# Patient Record
Sex: Male | Born: 1960 | Race: White | Hispanic: No | Marital: Married | Smoking: Never smoker
Health system: Southern US, Community
[De-identification: ages and names within clinical notes are randomized; demographics above are authoritative.]

## PROBLEM LIST (undated history)

## (undated) DIAGNOSIS — J45909 Unspecified asthma, uncomplicated: Secondary | ICD-10-CM

## (undated) DIAGNOSIS — Z8489 Family history of other specified conditions: Secondary | ICD-10-CM

## (undated) DIAGNOSIS — I10 Essential (primary) hypertension: Secondary | ICD-10-CM

## (undated) DIAGNOSIS — N289 Disorder of kidney and ureter, unspecified: Secondary | ICD-10-CM

## (undated) HISTORY — PX: SHOULDER ARTHROSCOPY WITH ROTATOR CUFF REPAIR: SHX5685

## (undated) HISTORY — PX: URETERAL STENT PLACEMENT: SHX822

---

## 2013-12-12 ENCOUNTER — Encounter (INDEPENDENT_AMBULATORY_CARE_PROVIDER_SITE_OTHER): Payer: Self-pay | Admitting: *Deleted

## 2015-05-14 ENCOUNTER — Other Ambulatory Visit (HOSPITAL_COMMUNITY): Payer: Self-pay | Admitting: Registered Nurse

## 2015-05-14 ENCOUNTER — Other Ambulatory Visit (HOSPITAL_COMMUNITY): Payer: Self-pay | Admitting: Family Medicine

## 2015-05-14 ENCOUNTER — Ambulatory Visit (HOSPITAL_COMMUNITY)
Admission: RE | Admit: 2015-05-14 | Discharge: 2015-05-14 | Disposition: A | Discharge status: Home / Self Care | Payer: BLUE CROSS/BLUE SHIELD | Source: Ambulatory Visit | Attending: Registered Nurse | Admitting: Registered Nurse

## 2015-05-14 DIAGNOSIS — I10 Essential (primary) hypertension: Secondary | ICD-10-CM | POA: Diagnosis not present

## 2015-05-14 DIAGNOSIS — M25512 Pain in left shoulder: Secondary | ICD-10-CM

## 2015-05-14 DIAGNOSIS — Z1389 Encounter for screening for other disorder: Secondary | ICD-10-CM | POA: Diagnosis not present

## 2015-05-14 DIAGNOSIS — E782 Mixed hyperlipidemia: Secondary | ICD-10-CM | POA: Diagnosis not present

## 2015-05-14 DIAGNOSIS — E6609 Other obesity due to excess calories: Secondary | ICD-10-CM | POA: Diagnosis not present

## 2015-05-14 DIAGNOSIS — Z6832 Body mass index (BMI) 32.0-32.9, adult: Secondary | ICD-10-CM | POA: Diagnosis not present

## 2015-06-24 DIAGNOSIS — Z1389 Encounter for screening for other disorder: Secondary | ICD-10-CM | POA: Diagnosis not present

## 2015-06-24 DIAGNOSIS — Z6832 Body mass index (BMI) 32.0-32.9, adult: Secondary | ICD-10-CM | POA: Diagnosis not present

## 2015-06-24 DIAGNOSIS — M25512 Pain in left shoulder: Secondary | ICD-10-CM | POA: Diagnosis not present

## 2015-07-04 DIAGNOSIS — M7552 Bursitis of left shoulder: Secondary | ICD-10-CM | POA: Diagnosis not present

## 2015-07-31 DIAGNOSIS — M7502 Adhesive capsulitis of left shoulder: Secondary | ICD-10-CM | POA: Diagnosis not present

## 2015-08-19 ENCOUNTER — Ambulatory Visit: Payer: BLUE CROSS/BLUE SHIELD | Attending: Orthopedic Surgery | Admitting: Physical Therapy

## 2015-08-19 DIAGNOSIS — M25612 Stiffness of left shoulder, not elsewhere classified: Secondary | ICD-10-CM | POA: Diagnosis not present

## 2015-08-19 DIAGNOSIS — M25512 Pain in left shoulder: Secondary | ICD-10-CM | POA: Insufficient documentation

## 2015-08-19 NOTE — Therapy (Signed)
[facility] [address] [place], [place], [address] Phone: [phone]   Fax:  [phone]  Physical Therapy Evaluation  Patient Details  Name: Jamie Russell MRN: [id number] Date of Birth: [birth year] Referring Provider: Ralene Bathe PA-C.  Encounter Date: 08/19/2015      PT End of Session - 08/19/15 1016    Visit Number 1   Number of Visits 12   Date for PT Re-Evaluation 09/16/15   PT Start Time 0902   Activity Tolerance Patient tolerated treatment well   Behavior During Therapy [facility] for tasks assessed/performed      No past medical history on file.  No past surgical history on file.  There were no vitals filed for this visit.       Subjective Assessment - 08/19/15 0957    Subjective I got an injection that was helpful.   Limitations --  "Reaching."   Patient Stated Goals Use my left shoulder without pain.   Currently in Pain? Yes   Pain Score 7    Pain Location Shoulder   Pain Orientation Left   Pain Descriptors / Indicators Aching   Pain Onset More than a month ago   Pain Frequency Constant   Aggravating Factors  Movement.   Pain Relieving Factors Rest and ice.   Effect of Pain on Daily Activities Can't use left arm well.            [facility] PT Assessment - 08/19/15 0001      Assessment   Medical Diagnosis Left shoulder adhesive capsulitis.   Referring Provider Ralene Bathe PA-C.   Onset Date/Surgical Date --  March 2017.     Precautions   Precautions None     Restrictions   Weight Bearing Restrictions Yes     Balance Screen   Has the patient fallen in the past 6 months Yes   How many times? --  3   Has the patient had a decrease in activity level because of a fear of falling?  Yes     Home Environment   Living Environment Private residence     Prior Function   Level of Independence Independent     Cognition   Overall Cognitive Status Within Functional Limits for tasks assessed      Posture/Postural Control   Posture/Postural Control Postural limitations   Postural Limitations --  Left scapular protraction.     ROM / Strength   AROM / PROM / Strength AROM;Strength     AROM   Overall AROM Comments Left shoulder flexion limited to 88 degrees; ER= 10 degrees and behind back to left greater trochanter region.     Strength   Overall Strength Comments Abduction= 3-/5; ER= 3+/5 and IR= 4-/5.     Palpation   Palpation comment Tender to palpation over left acromial ridge with referred pain into left middle deltoid region.     Special Tests    Special Tests --  2+/4+ LT UE DTR's; (+) Impingement LT shoulder.     Ambulation/Gait   Gait Comments WNL.                   [facility] Adult PT Treatment/Exercise - 08/19/15 0001      Modalities   Modalities Electrical Stimulation     Electrical Stimulation   Electrical Stimulation Location --  Left shoulder.   Electrical Stimulation Action IFC   Electrical Stimulation Parameters 80-150 Hz xat 100% scan x 20 minutes.   Electrical Stimulation Goals Pain  PT Education - 08/19/15 1014    Education provided Yes   Person(s) Educated Patient   Methods Explanation;Demonstration;Tactile cues   Comprehension Verbalized understanding;Returned demonstration             PT Long Term Goals - 08/19/15 1053      PT LONG TERM GOAL #1   Title Independent with a HEP.   Time 4   Period Weeks   Status New     PT LONG TERM GOAL #2   Title Active left shoulder flexion to 150 degrees so the patient can easily reach overhead   Time 4   Period Weeks   Status New     PT LONG TERM GOAL #3   Title Active ER to 70 degrees+ to allow for easily donning/doffing of apparel   Time 4   Period Weeks   Status New     PT LONG TERM GOAL #4   Title Increase ROM so patient is able to reach behind back to L4 with left hand.   Time 4   Period Weeks   Status New     PT LONG TERM GOAL #5   Title  Increase left shoulder strength to a solid 4+/5 to increase stability for performance of functional activities   Time 4   Period Weeks   Status New     Additional Long Term Goals   Additional Long Term Goals Yes     PT LONG TERM GOAL #6   Title Perform ADL's with pain not > 3/10.   Time 4   Period Weeks   Status New               Plan - 08/19/15 1017    Clinical Impression Statement The patient states that in March of 2017 he began to experience left shoulder pain.  He recalls prior to this having a fall a work and falling backward on an extended left hand.  He experienced left wrist pain but does not recall left shoulder pain at that time.  His left shoulder beagn to hurt more and he also began to lose mobility.  He states it felt like a golf ball under his shoulder blade but he rolled on it with a roller type object and that went away.  His sleep is disturbed by pain.  He has significant lose of motion in all directions and referred pain into the left midle deltoid region.  Pain rise to 7+/10 with certain end range movements.  Ice decreases his pain.  X-ray and MRI's were negative.   Rehab Potential Good   PT Frequency 3x / week   PT Duration 4 weeks   PT Treatment/Interventions ADLs/Self Care Home Management;Electrical Stimulation;Ultrasound;Moist Heat;Cryotherapy;Patient/family education;Therapeutic exercise;Therapeutic activities;Manual techniques;Passive range of motion;Vasopneumatic Device;Dry needling   PT Next Visit Plan Left shoulder capsular stretches; PROM; U/S while performing a manual left shoulder ER stretch.  Please review HEP.   Consulted and Agree with Plan of Care Patient      Patient will benefit from skilled therapeutic intervention in order to improve the following deficits and impairments:  Decreased range of motion, Decreased activity tolerance, Pain, Decreased strength  Visit Diagnosis: Pain in left shoulder - Plan: PT plan of care  cert/re-cert  Stiffness of left shoulder, not elsewhere classified - Plan: PT plan of care cert/re-cert     Problem List There are no active problems to display for this patient.   [place], [place] MPT 08/19/2015, 10:57 AM  [facility] [address] [place], [place], [address] Phone: [phone]   Fax:  [phone]  Name: Jamie Russell MRN: [id number] Date of Birth: [birth year]

## 2015-08-19 NOTE — Patient Instructions (Signed)
Instruct patient in wall climbs; countertop stretch to increase flexion and supine cane exercise to increase ER.  Please review next visit.

## 2015-08-21 ENCOUNTER — Ambulatory Visit: Payer: BLUE CROSS/BLUE SHIELD | Attending: Orthopedic Surgery | Admitting: Physical Therapy

## 2015-08-21 DIAGNOSIS — M25512 Pain in left shoulder: Secondary | ICD-10-CM | POA: Insufficient documentation

## 2015-08-21 DIAGNOSIS — M25612 Stiffness of left shoulder, not elsewhere classified: Secondary | ICD-10-CM

## 2015-08-21 NOTE — Patient Instructions (Addendum)
Flexibility: Corner Stretch    Standing in corner with hands just above shoulder level and feet ____ inches from corner, lean forward until a comfortable stretch is felt across chest. Hold ____ seconds. Repeat ____ times per set. Do ____ sets per session. Do ____ sessions per day.  http://orth.exer.us/342   Copyright  VHI. All rights reserved.  ROM: External Rotation (Alternate)    Keep palm of left hand against door frame and elbow bent at 90. Turn body from fixed hand until stretch is felt. Hold __10__ seconds. Repeat _10___ times per set. Do ____ sets per session. Do _4-5___ sessions per day.  http://orth.exer.us/764   Copyright  VHI. All rights reserved.   TOWEL STRETCH:  Place a large towel over your right shoulder and reach behind with your left hand. Pull on the towel with your right hand so that left hand goes up your back. Hold 10 seconds, repeat 10 times. Do 4-5x a day.

## 2015-08-21 NOTE — Therapy (Signed)
[facility] [address] [place], [place], [address] Phone: [phone]   Fax:  [phone]  Physical Therapy Treatment  Patient Details  Name: Jamie Russell MRN: [id number] Date of Birth: 1960/09/02 Referring Provider: Ralene Bathe PA-C.  Encounter Date: 08/21/2015      PT End of Session - 08/21/15 1039    Visit Number 2   Number of Visits 12   Date for PT Re-Evaluation 09/16/15   PT Start Time 1034   PT Stop Time 1123   PT Time Calculation (min) 49 min   Activity Tolerance Patient tolerated treatment well   Behavior During Therapy [facility] for tasks assessed/performed      No past medical history on file.  No past surgical history on file.  There were no vitals filed for this visit.      Subjective Assessment - 08/21/15 1038    Subjective Reports that he is not sleeping well at night but as he gets moving after waking the soreness diminishes. Reports that he has a pulley set in his garage and uses it every time goes into his garage.   Patient Stated Goals Use my left shoulder without pain.   Currently in Pain? Yes   Pain Score 2    Pain Location Shoulder   Pain Orientation Left   Pain Descriptors / Indicators Sore   Pain Onset More than a month ago            [facility] PT Assessment - 08/21/15 0001      Assessment   Medical Diagnosis Left shoulder adhesive capsulitis.   Next MD Visit 08/28/2015     Precautions   Precautions None                     [facility] Adult PT Treatment/Exercise - 08/21/15 0001      Exercises   Exercises Shoulder     Shoulder Exercises: Pulleys   Flexion 3 minutes     Shoulder Exercises: ROM/Strengthening   UBE (Upper Arm Bike) 120 RPM x5 min     Shoulder Exercises: Stretch   Internal Rotation Stretch Other (comment)  10 reps x10 sec hold   External Rotation Stretch Other (comment)  10 reps x10 sec hold     Modalities   Modalities Electrical  Stimulation;Cryotherapy;Ultrasound     Cryotherapy   Number Minutes Cryotherapy 15 Minutes   Cryotherapy Location Shoulder   Type of Cryotherapy Ice pack     Electrical Stimulation   Electrical Stimulation Location L shoulder    Electrical Stimulation Action IFC   Electrical Stimulation Parameters 80-150 hz x15 min   Electrical Stimulation Goals Pain     Ultrasound   Ultrasound Location L anterior shoulder with LLDS into ER   Ultrasound Parameters 1.5 w/cm2, 100%, x10 min   Ultrasound Goals Pain     Manual Therapy   Manual Therapy Joint mobilization;Soft tissue mobilization;Passive ROM   Joint Mobilization G I-II L glenohumeral joint mobilizations in A/P/I to reduce pain and promote mobility   Soft tissue mobilization STW of L Pectoralis in supine to decrease tightness present   Passive ROM PROM of L shoulder into flexion to increase ROM                PT Education - 08/21/15 1115    Education provided Yes   Education Details HEP- ER and towel stretch   Person(s) Educated Patient   Methods Explanation;Demonstration;Verbal cues;Handout   Comprehension Verbalized understanding;Returned demonstration;Verbal cues  required             PT Long Term Goals - 08/19/15 1053      PT LONG TERM GOAL #1   Title Independent with a HEP.   Time 4   Period Weeks   Status New     PT LONG TERM GOAL #2   Title Active left shoulder flexion to 150 degrees so the patient can easily reach overhead   Time 4   Period Weeks   Status New     PT LONG TERM GOAL #3   Title Active ER to 70 degrees+ to allow for easily donning/doffing of apparel   Time 4   Period Weeks   Status New     PT LONG TERM GOAL #4   Title Increase ROM so patient is able to reach behind back to L4 with left hand.   Time 4   Period Weeks   Status New     PT LONG TERM GOAL #5   Title Increase left shoulder strength to a solid 4+/5 to increase stability for performance of functional activities    Time 4   Period Weeks   Status New     Additional Long Term Goals   Additional Long Term Goals Yes     PT LONG TERM GOAL #6   Title Perform ADL's with pain not > 3/10.   Time 4   Period Weeks   Status New               Plan - 08/21/15 1154    Clinical Impression Statement Patient arrived to treatment with low level L shoulder soreness but still limited with ROM and sleeping  due to pain. Patient able to tolerate exercises fairly well and required assistance with pulleys and stretches due to ROM limitations. Patient accepted new capsular stretching HEP with education regarding parameters and technique. Patient also required education to utilize heat/ ice to his preference for 10-20 minutes at a time and to continue pulleys in his garage. L Pectoralis muscle tight upon palpation with moderate release with manual therapy. Normal modalities response noted following removal of the modalities. L shoulder ER very limited during LLDS with [place]. Patient reported L shoulder feeling "good" following today's treatment.   Rehab Potential Good   PT Frequency 3x / week   PT Duration 4 weeks   PT Treatment/Interventions ADLs/Self Care Home Management;Electrical Stimulation;Ultrasound;Moist Heat;Cryotherapy;Patient/family education;Therapeutic exercise;Therapeutic activities;Manual techniques;Passive range of motion;Vasopneumatic Device;Dry needling   PT Next Visit Plan Continue L capsular stretching, exercises with modalities as needed per MPT POC.   PT Home Exercise Plan HEP- ER , towel stretch   Consulted and Agree with Plan of Care Patient      Patient will benefit from skilled therapeutic intervention in order to improve the following deficits and impairments:  Decreased range of motion, Decreased activity tolerance, Pain, Decreased strength  Visit Diagnosis: Pain in left shoulder  Stiffness of left shoulder, not elsewhere classified     Problem List There are no active problems to  display for this patient.   Evelene Croon, PTA 08/21/2015, 12:00 PM  [facility] [address] [place], [place], [address] Phone: [phone]   Fax:  [phone]  Name: Jamie Russell MRN: [id number] Date of Birth: 1960/09/27

## 2015-08-26 ENCOUNTER — Ambulatory Visit: Payer: BLUE CROSS/BLUE SHIELD | Admitting: Physical Therapy

## 2015-08-26 ENCOUNTER — Encounter: Payer: Self-pay | Admitting: Physical Therapy

## 2015-08-26 DIAGNOSIS — M25512 Pain in left shoulder: Secondary | ICD-10-CM | POA: Diagnosis not present

## 2015-08-26 DIAGNOSIS — M25612 Stiffness of left shoulder, not elsewhere classified: Secondary | ICD-10-CM

## 2015-08-26 NOTE — Therapy (Signed)
[facility] [address] [place], [place], [address] Phone: [phone]   Fax:  [phone]  Physical Therapy Treatment  Patient Details  Name: Jamie Russell MRN: [id number] Date of Birth: 06/27/1960 Referring Provider: Ralene Bathe PA-C.  Encounter Date: 08/26/2015      PT End of Session - 08/26/15 1039    Visit Number 3   Number of Visits 12   Date for PT Re-Evaluation 09/16/15   PT Start Time 0944   PT Stop Time 1044   PT Time Calculation (min) 60 min   Activity Tolerance Patient tolerated treatment well   Behavior During Therapy [facility] for tasks assessed/performed      History reviewed. No pertinent past medical history.  History reviewed. No pertinent surgical history.  There were no vitals filed for this visit.      Subjective Assessment - 08/26/15 1009    Subjective Patient reported feeling better after last treatment   Patient Stated Goals Use my left shoulder without pain.   Currently in Pain? Yes   Pain Score 2    Pain Location Shoulder   Pain Orientation Left   Pain Descriptors / Indicators Sore   Pain Onset More than a month ago   Pain Frequency Constant   Aggravating Factors  movement or night   Pain Relieving Factors rest            [facility] PT Assessment - 08/26/15 0001      ROM / Strength   AROM / PROM / Strength PROM     PROM   PROM Assessment Site Shoulder   Right/Left Shoulder Left   Left Shoulder Flexion 124 Degrees   Left Shoulder External Rotation 25 Degrees                     [facility] Adult PT Treatment/Exercise - 08/26/15 0001      Shoulder Exercises: Pulleys   Flexion --      Shoulder Exercises: ROM/Strengthening   UBE (Upper Arm Bike) 120 RPM x8 min     Electrical Stimulation   Electrical Stimulation Location L shoulder    Electrical Stimulation Action IFC   Electrical Stimulation Parameters 80-150hz    Electrical Stimulation Goals Pain     Ultrasound   Ultrasound Location left anterior shoulder with LLLD stretch into ER   Ultrasound Parameters 1.5 w/cm2/100%/45mhz x48min   Ultrasound Goals Pain     Manual Therapy   Manual Therapy Joint mobilization;Soft tissue mobilization;Passive ROM   Joint Mobilization G I-II L glenohumeral joint mobilizations in A/P/I to reduce pain and promote mobility   Soft tissue mobilization STW of L Pectoralis in supine to decrease tightness present   Passive ROM PROM of L shoulder into ER / flexion to increase ROM, ant cap stretch                     PT Long Term Goals - 08/26/15 1041      PT LONG TERM GOAL #1   Title Independent with a HEP.   Time 4   Period Weeks   Status On-going     PT LONG TERM GOAL #2   Title Active left shoulder flexion to 150 degrees so the patient can easily reach overhead   Time 4   Period Weeks   Status On-going     PT LONG TERM GOAL #3   Title Active ER to 70 degrees+ to allow for easily donning/doffing of apparel   Time  4   Period Weeks   Status On-going     PT LONG TERM GOAL #4   Title Increase ROM so patient is able to reach behind back to L4 with left hand.   Time 4   Period Weeks   Status On-going     PT LONG TERM GOAL #5   Title Increase left shoulder strength to a solid 4+/5 to increase stability for performance of functional activities   Time 4   Period Weeks   Status On-going     PT LONG TERM GOAL #6   Title Perform ADL's with pain not > 3/10.   Time 4   Period Weeks   Status On-going               Plan - 08/26/15 1042    Clinical Impression Statement Patient continues to progress with ROM yet patients shoulder is very stiff and painful with ROM. Patient has more pain with movement or at night when laying on left side. Patient has less palpable tightness in left pectoralis muscle today yet has referred pain with capsular stretching. Patient goals ongoing today due to ROM and pain deficits.    Rehab Potential Good   PT Frequency  3x / week   PT Duration 4 weeks   PT Treatment/Interventions ADLs/Self Care Home Management;Electrical Stimulation;Ultrasound;Moist Heat;Cryotherapy;Patient/family education;Therapeutic exercise;Therapeutic activities;Manual techniques;Passive range of motion;Vasopneumatic Device;Dry needling   PT Next Visit Plan Continue L capsular stretching, exercises with modalities as needed per MPT POC.   Consulted and Agree with Plan of Care Patient      Patient will benefit from skilled therapeutic intervention in order to improve the following deficits and impairments:  Decreased range of motion, Decreased activity tolerance, Pain, Decreased strength  Visit Diagnosis: Pain in left shoulder  Stiffness of left shoulder, not elsewhere classified     Problem List There are no active problems to display for this patient.   Zannah Melucci P, PTA 08/26/2015, 10:56 AM   Cathie Hoopshristina Jakaya Jacobowitz, PTA 08/26/15 10:56 AM    Clarita CraneStephanie F Matthews, PT, DPT 08/26/15 12:47 PM   [facility] [address] [place], [place], [address] Phone: [phone]   Fax:  [phone]  Name: Jamie SnowballHoward Kloc MRN: [id number] Date of Birth: 07-27-1960

## 2015-08-28 DIAGNOSIS — M7502 Adhesive capsulitis of left shoulder: Secondary | ICD-10-CM | POA: Diagnosis not present

## 2015-08-29 ENCOUNTER — Encounter: Payer: Self-pay | Admitting: Physical Therapy

## 2015-08-29 ENCOUNTER — Ambulatory Visit: Payer: BLUE CROSS/BLUE SHIELD | Admitting: Physical Therapy

## 2015-08-29 DIAGNOSIS — M25512 Pain in left shoulder: Secondary | ICD-10-CM | POA: Diagnosis not present

## 2015-08-29 DIAGNOSIS — M25612 Stiffness of left shoulder, not elsewhere classified: Secondary | ICD-10-CM | POA: Diagnosis not present

## 2015-08-29 NOTE — Therapy (Signed)
[facility] [address] [place], [place], [address] Phone: [phone]   Fax:  [phone]  Physical Therapy Treatment  Patient Details  Name: Arvin Abello MRN: [id number] Date of Birth: 1960/09/30 Referring Provider: Ralene Bathe PA-C.  Encounter Date: 08/29/2015      PT End of Session - 08/29/15 1031    Visit Number 4   Number of Visits 12   Date for PT Re-Evaluation 09/16/15   PT Start Time 0947   PT Stop Time 1046   PT Time Calculation (min) 59 min   Activity Tolerance Patient tolerated treatment well   Behavior During Therapy [facility] for tasks assessed/performed      History reviewed. No pertinent past medical history.  History reviewed. No pertinent surgical history.  There were no vitals filed for this visit.      Subjective Assessment - 08/29/15 0953    Subjective Patient went to MD. Supple and had a shot in bursa and is to continue with aggressive ROM 2-3 x week for 4 weeks   Patient Stated Goals Use my left shoulder without pain.   Currently in Pain? Yes   Pain Score 6    Pain Location Shoulder   Pain Orientation Left   Pain Descriptors / Indicators Sore   Pain Onset More than a month ago   Pain Frequency Constant   Aggravating Factors  movement   Pain Relieving Factors rest            [facility] PT Assessment - 08/29/15 0001      PROM   PROM Assessment Site Shoulder   Right/Left Shoulder Left   Left Shoulder Flexion 130 Degrees   Left Shoulder External Rotation 40 Degrees                     [facility] Adult PT Treatment/Exercise - 08/29/15 0001      Modalities   Modalities Moist Heat;Electrical Stimulation     Moist Heat Therapy   Moist Heat Location Shoulder  ongoing with stretching today to help relax muscle     Electrical Stimulation   Electrical Stimulation Location L shoulder    Electrical Stimulation Action IFC   Electrical Stimulation Parameters 80-150hz    Electrical Stimulation  Goals Pain     Manual Therapy   Manual Therapy Joint mobilization;Soft tissue mobilization;Passive ROM   Joint Mobilization G I-II L glenohumeral joint mobilizations in A/P/I to reduce pain and promote mobility   Passive ROM PROM and aggressive stretching of L shoulder into ER / flexion to increase ROM, ant cap stretch                     PT Long Term Goals - 08/26/15 1041      PT LONG TERM GOAL #1   Title Independent with a HEP.   Time 4   Period Weeks   Status On-going     PT LONG TERM GOAL #2   Title Active left shoulder flexion to 150 degrees so the patient can easily reach overhead   Time 4   Period Weeks   Status On-going     PT LONG TERM GOAL #3   Title Active ER to 70 degrees+ to allow for easily donning/doffing of apparel   Time 4   Period Weeks   Status On-going     PT LONG TERM GOAL #4   Title Increase ROM so patient is able to reach behind back to L4 with left hand.  Time 4   Period Weeks   Status On-going     PT LONG TERM GOAL #5   Title Increase left shoulder strength to a solid 4+/5 to increase stability for performance of functional activities   Time 4   Period Weeks   Status On-going     PT LONG TERM GOAL #6   Title Perform ADL's with pain not > 3/10.   Time 4   Period Weeks   Status On-going               Plan - 08/29/15 1035    Clinical Impression Statement Patient progressing with ROM today. Patient is very guarded and has tightness noted in shoulder with all motions esp ER. Today focused o manual stretching per MD for aggressive PROM. MD. told patient he will have a manipulation if unable to improve ROM. Patient able to tolerate well with cues to relax. Performed only ROM today due to a shot yesterday. Patient goals ongoing due to ROM deficits.    Rehab Potential Good   PT Frequency 3x / week   PT Duration 4 weeks   PT Treatment/Interventions ADLs/Self Care Home Management;Electrical Stimulation;Ultrasound;Moist  Heat;Cryotherapy;Patient/family education;Therapeutic exercise;Therapeutic activities;Manual techniques;Passive range of motion;Vasopneumatic Device;Dry needling   PT Next Visit Plan cont with aggressive PROM per MD new order to avoid a manipulation.    Consulted and Agree with Plan of Care Patient      Patient will benefit from skilled therapeutic intervention in order to improve the following deficits and impairments:  Decreased range of motion, Decreased activity tolerance, Pain, Decreased strength  Visit Diagnosis: Pain in left shoulder  Stiffness of left shoulder, not elsewhere classified     Problem List There are no active problems to display for this patient.   Hermelinda DellenDUNFORD, Sakeena Teall P, PTA 08/29/2015, 10:46 AM  [facility] [address] [place], [place], [address] Phone: [phone]   Fax:  [phone]  Name: Lamont SnowballHoward Schult MRN: [id number] Date of Birth: [birth year]

## 2015-09-02 ENCOUNTER — Ambulatory Visit: Payer: BLUE CROSS/BLUE SHIELD | Admitting: Physical Therapy

## 2015-09-02 DIAGNOSIS — M25612 Stiffness of left shoulder, not elsewhere classified: Secondary | ICD-10-CM

## 2015-09-02 DIAGNOSIS — M25512 Pain in left shoulder: Secondary | ICD-10-CM | POA: Diagnosis not present

## 2015-09-02 NOTE — Therapy (Signed)
[facility] [address] [place], [place], [address] Phone: [phone]   Fax:  [phone]  Physical Therapy Treatment  Patient Details  Name: Jamie SnowballHoward Russell MRN: [id number] Date of Birth: [birth year] Referring Provider: Ralene Batheracy Shuford PA-C.  Encounter Date: 09/02/2015      PT End of Session - 09/02/15 1435    Visit Number 5   Number of Visits 12   Date for PT Re-Evaluation 09/16/15   PT Start Time 1432   PT Stop Time 1540   PT Time Calculation (min) 68 min   Activity Tolerance Patient tolerated treatment well   Behavior During Therapy [facility] for tasks assessed/performed      No past medical history on file.  No past surgical history on file.  There were no vitals filed for this visit.      Subjective Assessment - 09/02/15 1434    Subjective Reports only intermittant UE soreness today. Reports that he thinks the shot has improved his stiffness. Experiencing less severe soreness around L shoulder blade.   Patient Stated Goals Use my left shoulder without pain.   Currently in Pain? Yes   Pain Score 3    Pain Location Shoulder   Pain Orientation Left   Pain Descriptors / Indicators Sore   Pain Onset More than a month ago   Pain Frequency Intermittent            [facility] PT Assessment - 09/02/15 0001      Assessment   Medical Diagnosis Left shoulder adhesive capsulitis.   Next MD Visit 09/28/2015     Precautions   Precautions None                     [facility] Adult PT Treatment/Exercise - 09/02/15 0001      Shoulder Exercises: Pulleys   Flexion Other (comment)  x5 min   Other Pulley Exercises LUE ranger flex/circles x20 reps each     Shoulder Exercises: ROM/Strengthening   UBE (Upper Arm Bike) 120 RPM x5 min     Shoulder Exercises: Stretch   Internal Rotation Stretch 10 seconds  x5 reps   External Rotation Stretch Other (comment)  x5 min with UE range     Modalities   Modalities Moist Heat;Electrical  Stimulation     Moist Heat Therapy   Number Minutes Moist Heat 15 Minutes   Moist Heat Location Shoulder     Electrical Stimulation   Electrical Stimulation Location L shoulder    Electrical Stimulation Action IFC   Electrical Stimulation Parameters 80-150 hz x15 min   Electrical Stimulation Goals Pain     Ultrasound   Ultrasound Location L shoulder from anterior to posterior   Ultrasound Parameters 1.2 w/c,2 100%, 3.3 mhz x10 min   Ultrasound Goals Pain     Manual Therapy   Manual Therapy Soft tissue mobilization;Passive ROM   Soft tissue mobilization STW of L Pectoralis/ Deltoids in supine to decrease tightness present   Passive ROM PROM of L shouler into flexion and ER with holds at end range                     PT Long Term Goals - 08/26/15 1041      PT LONG TERM GOAL #1   Title Independent with a HEP.   Time 4   Period Weeks   Status On-going     PT LONG TERM GOAL #2   Title Active left shoulder flexion to 150 degrees  so the patient can easily reach overhead   Time 4   Period Weeks   Status On-going     PT LONG TERM GOAL #3   Title Active ER to 70 degrees+ to allow for easily donning/doffing of apparel   Time 4   Period Weeks   Status On-going     PT LONG TERM GOAL #4   Title Increase ROM so patient is able to reach behind back to L4 with left hand.   Time 4   Period Weeks   Status On-going     PT LONG TERM GOAL #5   Title Increase left shoulder strength to a solid 4+/5 to increase stability for performance of functional activities   Time 4   Period Weeks   Status On-going     PT LONG TERM GOAL #6   Title Perform ADL's with pain not > 3/10.   Time 4   Period Weeks   Status On-going               Plan - 09/02/15 1533    Clinical Impression Statement Patient continues to demonstrate improvements with L shoulder ROM with stretching using UE ranger and well as with PROM into flexion. Patient continues to have L anterior capsule  tightness as evidenced by difficulty with IR towel stretch. Patient experienced greater ease with UBE exercise today as he stated he felt as he was going faster today. Normal modalities response noted following removal of the modalities. FIrm end feel noted with PROM into L shoulder flexion and ER but ER continues to be very limited to just neutral position. Tightness noted with L Deltoids and minimal tightness noted into L Pectoralis upon palpation.   Rehab Potential Good   PT Frequency 3x / week   PT Duration 4 weeks   PT Treatment/Interventions ADLs/Self Care Home Management;Electrical Stimulation;Ultrasound;Moist Heat;Cryotherapy;Patient/family education;Therapeutic exercise;Therapeutic activities;Manual techniques;Passive range of motion;Vasopneumatic Device;Dry needling   PT Next Visit Plan cont with aggressive PROM per MD new order to avoid a manipulation.    PT Home Exercise Plan HEP- ER , towel stretch   Consulted and Agree with Plan of Care Patient      Patient will benefit from skilled therapeutic intervention in order to improve the following deficits and impairments:  Decreased range of motion, Decreased activity tolerance, Pain, Decreased strength  Visit Diagnosis: Pain in left shoulder  Stiffness of left shoulder, not elsewhere classified     Problem List There are no active problems to display for this patient.   Evelene CroonKelsey M Parsons, PTA 09/02/2015, 3:47 PM  [facility] [address] [place], [place], [address] Phone: [phone]   Fax:  [phone]  Name: Jamie SnowballHoward Russell MRN: [id number] Date of Birth: Jun 18, 1960

## 2015-09-03 ENCOUNTER — Ambulatory Visit: Payer: BLUE CROSS/BLUE SHIELD | Admitting: *Deleted

## 2015-09-03 DIAGNOSIS — M25612 Stiffness of left shoulder, not elsewhere classified: Secondary | ICD-10-CM

## 2015-09-03 DIAGNOSIS — M25512 Pain in left shoulder: Secondary | ICD-10-CM | POA: Diagnosis not present

## 2015-09-03 NOTE — Therapy (Signed)
[facility] [address] [place], [place], [address] Phone: [phone]   Fax:  [phone]  Physical Therapy Treatment  Patient Details  Name: Jamie SnowballHoward Russell MRN: [id number] Date of Birth: [birth year] Referring Provider: Ralene Batheracy Shuford PA-C.  Encounter Date: 09/03/2015      PT End of Session - 09/03/15 0824    Visit Number 6   Number of Visits 12   Date for PT Re-Evaluation 09/16/15   PT Start Time 0815   PT Stop Time 0915   PT Time Calculation (min) 60 min      No past medical history on file.  No past surgical history on file.  There were no vitals filed for this visit.      Subjective Assessment - 09/03/15 0817    Subjective Lt shldr ached all night. 8/10   Patient Stated Goals Use my left shoulder without pain.   Currently in Pain? Yes   Pain Score 8    Pain Location Shoulder   Pain Orientation Left   Pain Descriptors / Indicators Sore   Pain Onset More than a month ago                         [facility] Adult PT Treatment/Exercise - 09/03/15 0001      Shoulder Exercises: Pulleys   Flexion Other (comment)  x5 min   Other Pulley Exercises LUE ranger flexion and ER stretching x 5 mins     Shoulder Exercises: ROM/Strengthening   UBE (Upper Arm Bike) 120 RPM x5 min     Shoulder Exercises: Stretch   Internal Rotation Stretch 10 seconds  x5 reps     Modalities   Modalities Moist Heat;Electrical Stimulation     Moist Heat Therapy   Number Minutes Moist Heat 15 Minutes   Moist Heat Location Shoulder     Electrical Stimulation   Electrical Stimulation Location L shoulder IFC x15 mins80-150hz    Electrical Stimulation Goals Pain     Ultrasound   Ultrasound Location LT shldr   Ultrasound Parameters 1.5 w/cm2 x 10 mins with ER stretch   Ultrasound Goals Pain     Manual Therapy   Manual Therapy Soft tissue mobilization;Passive ROM   Soft tissue mobilization STW of L Pectoralis/ Deltoids in supine  to decrease tightness present   Passive ROM PROM of L shouler into flexion, and IR / ER with holds at end range                     PT Long Term Goals - 08/26/15 1041      PT LONG TERM GOAL #1   Title Independent with a HEP.   Time 4   Period Weeks   Status On-going     PT LONG TERM GOAL #2   Title Active left shoulder flexion to 150 degrees so the patient can easily reach overhead   Time 4   Period Weeks   Status On-going     PT LONG TERM GOAL #3   Title Active ER to 70 degrees+ to allow for easily donning/doffing of apparel   Time 4   Period Weeks   Status On-going     PT LONG TERM GOAL #4   Title Increase ROM so patient is able to reach behind back to L4 with left hand.   Time 4   Period Weeks   Status On-going     PT LONG TERM GOAL #5   Title  Increase left shoulder strength to a solid 4+/5 to increase stability for performance of functional activities   Time 4   Period Weeks   Status On-going     PT LONG TERM GOAL #6   Title Perform ADL's with pain not > 3/10.   Time 4   Period Weeks   Status On-going               Plan - 09/03/15 [id number]    Clinical Impression Statement Pt did fairly well with Exs and manual work. His Flexion ROM and IR ROM are progressing, But ER ROM continues to have the greatest deficit. Pain at night was Pt.'s chief complaint today. Goals are ongoing    Rehab Potential Good   PT Frequency 3x / week   PT Duration 4 weeks   PT Treatment/Interventions ADLs/Self Care Home Management;Electrical Stimulation;Ultrasound;Moist Heat;Cryotherapy;Patient/family education;Therapeutic exercise;Therapeutic activities;Manual techniques;Passive range of motion;Vasopneumatic Device;Dry needling   PT Next Visit Plan cont with aggressive PROM per MD new order to avoid a manipulation.    PT Home Exercise Plan HEP- ER , towel stretch   Consulted and Agree with Plan of Care Patient      Patient will benefit from skilled therapeutic  intervention in order to improve the following deficits and impairments:  Decreased range of motion, Decreased activity tolerance, Pain, Decreased strength  Visit Diagnosis: Pain in left shoulder  Stiffness of left shoulder, not elsewhere classified     Problem List There are no active problems to display for this patient.   Abie Cheek,CHRIS, PTA 09/03/2015, 11:03 AM  [facility] [address] [place], [place], [address] Phone: [phone]   Fax:  [phone]  Name: Jamie Russell MRN: [id number] Date of Birth: May 28, 1960

## 2015-09-06 ENCOUNTER — Ambulatory Visit: Payer: BLUE CROSS/BLUE SHIELD | Admitting: *Deleted

## 2015-09-06 DIAGNOSIS — M25512 Pain in left shoulder: Secondary | ICD-10-CM

## 2015-09-06 DIAGNOSIS — M25612 Stiffness of left shoulder, not elsewhere classified: Secondary | ICD-10-CM

## 2015-09-06 NOTE — Therapy (Signed)
[facility] [address] [place], [place], [address] Phone: [phone]   Fax:  [phone]  Physical Therapy Treatment  Patient Details  Name: Jamie Russell MRN: [id number] Date of Birth: 04/21/1960 Referring Provider: Ralene Batheracy Shuford PA-C.  Encounter Date: 09/06/2015      PT End of Session - 09/06/15 0827    Visit Number 7   Number of Visits 12   Date for PT Re-Evaluation 09/16/15   PT Start Time 0815   PT Stop Time 0914   PT Time Calculation (min) 59 min      No past medical history on file.  No past surgical history on file.  There were no vitals filed for this visit.      Subjective Assessment - 09/06/15 0824    Subjective Lt shldr did better after last Rx. 3/10 today   Patient Stated Goals Use my left shoulder without pain.   Currently in Pain? Yes   Pain Score 3    Pain Location Shoulder   Pain Orientation Left   Pain Descriptors / Indicators Aching;Sore   Pain Onset More than a month ago   Pain Frequency Intermittent                         [facility] Adult PT Treatment/Exercise - 09/06/15 0001      Shoulder Exercises: Pulleys   Flexion Other (comment)  x5 min   Other Pulley Exercises LUE ranger flexion and ER stretching x 5 mins     Shoulder Exercises: ROM/Strengthening   UBE (Upper Arm Bike) 120 RPM x 8 min     Modalities   Modalities Moist Heat;Electrical Stimulation     Moist Heat Therapy   Number Minutes Moist Heat 15 Minutes   Moist Heat Location Shoulder     Electrical Stimulation   Electrical Stimulation Location L shoulder IFC x15 mins80-150hz    Electrical Stimulation Goals Pain     Ultrasound   Ultrasound Location LT shldr   Ultrasound Parameters 1.5 w/cm2 x 10 mins   Ultrasound Goals Pain     Manual Therapy   Manual Therapy Soft tissue mobilization;Passive ROM   Soft tissue mobilization STW of L Pectoralis/ Deltoids in supine to decrease tightness present   Passive ROM  PROM of L shouler into flexion, and IR / ER with holds at end range                     PT Long Term Goals - 08/26/15 1041      PT LONG TERM GOAL #1   Title Independent with a HEP.   Time 4   Period Weeks   Status On-going     PT LONG TERM GOAL #2   Title Active left shoulder flexion to 150 degrees so the patient can easily reach overhead   Time 4   Period Weeks   Status On-going     PT LONG TERM GOAL #3   Title Active ER to 70 degrees+ to allow for easily donning/doffing of apparel   Time 4   Period Weeks   Status On-going     PT LONG TERM GOAL #4   Title Increase ROM so patient is able to reach behind back to L4 with left hand.   Time 4   Period Weeks   Status On-going     PT LONG TERM GOAL #5   Title Increase left shoulder strength to a solid 4+/5 to increase stability  for performance of functional activities   Time 4   Period Weeks   Status On-going     PT LONG TERM GOAL #6   Title Perform ADL's with pain not > 3/10.   Time 4   Period Weeks   Status On-going               Plan - 09/06/15 [id number]    Clinical Impression Statement Pt arrived to clinic with less pain in LT shldr today and feels that Rxs are helping. Pain at night is still a complaint. He did well with Theres and Manual techniques today and his ROM for flexion was 132 degrees and ER was 25 degree and goals are ongoings   Rehab Potential Good   PT Frequency 3x / week   PT Duration 4 weeks   PT Treatment/Interventions ADLs/Self Care Home Management;Electrical Stimulation;Ultrasound;Moist Heat;Cryotherapy;Patient/family education;Therapeutic exercise;Therapeutic activities;Manual techniques;Passive range of motion;Vasopneumatic Device;Dry needling   PT Next Visit Plan cont with aggressive PROM per MD new order to avoid a manipulation.    PT Home Exercise Plan HEP- ER , towel stretch   Consulted and Agree with Plan of Care Patient      Patient will benefit from skilled therapeutic  intervention in order to improve the following deficits and impairments:  Decreased range of motion, Decreased activity tolerance, Pain, Decreased strength  Visit Diagnosis: Pain in left shoulder  Stiffness of left shoulder, not elsewhere classified     Problem List There are no active problems to display for this patient.   Kailo Kosik,CHRIS, PTA 09/06/2015, 9:42 AM  [facility] [address] [place], [place], [address] Phone: [phone]   Fax:  [phone]  Name: Jamie Russell MRN: [id number] Date of Birth: March 22, 1960

## 2015-09-09 ENCOUNTER — Ambulatory Visit: Payer: BLUE CROSS/BLUE SHIELD | Admitting: Physical Therapy

## 2015-09-09 ENCOUNTER — Encounter: Payer: Self-pay | Admitting: Physical Therapy

## 2015-09-09 DIAGNOSIS — M25612 Stiffness of left shoulder, not elsewhere classified: Secondary | ICD-10-CM

## 2015-09-09 DIAGNOSIS — M25512 Pain in left shoulder: Secondary | ICD-10-CM

## 2015-09-09 NOTE — Therapy (Signed)
[facility] [address] [place], [place], [address] Phone: [phone]   Fax:  [phone]  Physical Therapy Treatment  Patient Details  Name: Jamie Russell MRN: [id number] Date of Birth: [birth year] Referring Provider: Ralene Batheracy Shuford PA-C.  Encounter Date: 09/09/2015      PT End of Session - 09/09/15 0946    Visit Number 8   Number of Visits 12   Date for PT Re-Evaluation 09/16/15   PT Start Time 0901   PT Stop Time 0959   PT Time Calculation (min) 58 min   Activity Tolerance Patient tolerated treatment well   Behavior During Therapy [facility] for tasks assessed/performed      History reviewed. No pertinent past medical history.  History reviewed. No pertinent surgical history.  There were no vitals filed for this visit.      Subjective Assessment - 09/09/15 0903    Subjective Patient reported shoulder being sore all weekend, yet feels some improvement with ROM   Patient Stated Goals Use my left shoulder without pain.   Currently in Pain? Yes   Pain Score 3    Pain Location Shoulder   Pain Orientation Left   Pain Descriptors / Indicators Aching;Sore   Pain Onset More than a month ago   Pain Frequency Intermittent   Aggravating Factors  movement   Pain Relieving Factors rest            [facility] PT Assessment - 09/09/15 0001      ROM / Strength   AROM / PROM / Strength AROM;PROM     AROM   AROM Assessment Site Shoulder   Right/Left Shoulder Left   Left Shoulder External Rotation 10 Degrees     PROM   PROM Assessment Site Shoulder   Right/Left Shoulder Left   Left Shoulder Flexion 140 Degrees   Left Shoulder External Rotation 40 Degrees                     [facility] Adult PT Treatment/Exercise - 09/09/15 0001      Shoulder Exercises: Pulleys   Flexion Other (comment)  5 min   Other Pulley Exercises LUE ranger flexion and ER stretching x 5 mins     Shoulder Exercises: ROM/Strengthening   UBE (Upper  Arm Bike) 120 RPM x 6 min     Moist Heat Therapy   Number Minutes Moist Heat 15 Minutes   Moist Heat Location Shoulder     Electrical Stimulation   Electrical Stimulation Location left shoulder   Electrical Stimulation Action IFC   Electrical Stimulation Parameters 80-150hz    Electrical Stimulation Goals Pain     Ultrasound   Ultrasound Location left shouler   Ultrasound Parameters 1.5w/cm2/100%/551mhz x668min with LLLDS into ER   Ultrasound Goals Pain;Other (Comment)  ROM     Manual Therapy   Manual Therapy Passive ROM   Passive ROM PROM of L shouler into flexion, and IR / ER with holds at end range, capsular stretching and joint mobilization for all left shoulder motions to increase ROM                     PT Long Term Goals - 09/09/15 0948      PT LONG TERM GOAL #1   Title Independent with a HEP.   Time 4   Period Weeks   Status On-going     PT LONG TERM GOAL #2   Title Active left shoulder flexion to 150 degrees so the  patient can easily reach overhead   Time 4   Period Weeks   Status On-going     PT LONG TERM GOAL #3   Title Active ER to 70 degrees+ to allow for easily donning/doffing of apparel   Time 4   Period Weeks   Status On-going     PT LONG TERM GOAL #4   Title Increase ROM so patient is able to reach behind back to L4 with left hand.   Time 4   Period Weeks   Status On-going     PT LONG TERM GOAL #5   Title Increase left shoulder strength to a solid 4+/5 to increase stability for performance of functional activities   Time 4   Period Weeks   Status On-going     PT LONG TERM GOAL #6   Title Perform ADL's with pain not > 3/10.   Time 4   Period Weeks   Status On-going               Plan - 09/09/15 0949    Clinical Impression Statement Patient tolerated treatment with some increased pain with stretching today. Today continued with aggresive stretching for left shoulder flexion and ER and ER with US and LLLDS to increase ROM.  Patient has improved with ROM for left shouler flexion, ER was the same of 40 degrees in scaption. Patient goals ongoing due to ROM and pain deficits.   Rehab Potential Good   PT Frequency 3x / week   PT Duration 4 weeks   PT Treatment/Interventions ADLs/Self Care Home Management;Electrical Stimulation;Ultrasound;Moist Heat;Cryotherapy;Patient/family education;Therapeutic exercise;Therapeutic activities;Manual techniques;Passive range of motion;Vasopneumatic Device;Dry needling   PT Next Visit Plan cont with aggressive PROM per MD new order to avoid a manipulation.    Consulted and Agree with Plan of Care Patient      Patient will benefit from skilled therapeutic intervention in order to improve the following deficits and impairments:  Decreased range of motion, Decreased activity tolerance, Pain, Decreased strength  Visit Diagnosis: Pain in left shoulder  Stiffness of left shoulder, not elsewhere classified     Problem List There are no active problems to display for this patient.   Hermelinda DellenDUNFORD, Derec Mozingo P, PTA 09/09/2015, 10:02 AM  [facility] [address] [place], [place], [address] Phone: [phone]   Fax:  [phone]  Name: Jamie Russell MRN: [id number] Date of Birth: Apr 03, 1960

## 2015-09-11 ENCOUNTER — Encounter: Payer: Self-pay | Admitting: Physical Therapy

## 2015-09-11 ENCOUNTER — Ambulatory Visit: Payer: BLUE CROSS/BLUE SHIELD | Admitting: Physical Therapy

## 2015-09-11 DIAGNOSIS — M25512 Pain in left shoulder: Secondary | ICD-10-CM | POA: Diagnosis not present

## 2015-09-11 DIAGNOSIS — M25612 Stiffness of left shoulder, not elsewhere classified: Secondary | ICD-10-CM

## 2015-09-11 NOTE — Therapy (Signed)
[facility] [address] [place], [place], [address] Phone: [phone]   Fax:  [phone]  Physical Therapy Treatment  Patient Details  Name: Jamie Russell MRN: [id number] Date of Birth: 03-Feb-1960 Referring Provider: Ralene Batheracy Shuford PA-C.  Encounter Date: 09/11/2015      PT End of Session - 09/11/15 0844    Visit Number 9   Number of Visits 12   Date for PT Re-Evaluation 09/16/15   PT Start Time 0814   PT Stop Time 0913   PT Time Calculation (min) 59 min   Activity Tolerance Patient tolerated treatment well   Behavior During Therapy [facility] for tasks assessed/performed      History reviewed. No pertinent past medical history.  History reviewed. No pertinent surgical history.  There were no vitals filed for this visit.      Subjective Assessment - 09/11/15 0817    Subjective Patient had no new complaints today   Patient Stated Goals Use my left shoulder without pain.   Currently in Pain? Yes   Pain Score 3    Pain Location Shoulder   Pain Orientation Left   Pain Descriptors / Indicators Sore;Tightness   Pain Onset More than a month ago   Pain Frequency Intermittent   Aggravating Factors  movement and stretching   Pain Relieving Factors at rest            [facility] Assessment - 09/11/15 0001      ROM / Strength   AROM / PROM / Strength PROM;AROM     AROM   AROM Assessment Site Shoulder   Right/Left Shoulder Left   Left Shoulder External Rotation 13 Degrees     PROM   PROM Assessment Site Shoulder   Right/Left Shoulder Left   Left Shoulder Flexion 138 Degrees   Left Shoulder External Rotation 35 Degrees                     [facility] Adult PT Treatment/Exercise - 09/11/15 0001      Shoulder Exercises: Pulleys   Flexion Other (comment)  5min   Other Pulley Exercises LUE ranger flexion and ER stretching x 5 mins     Shoulder Exercises: ROM/Strengthening   UBE (Upper Arm Bike) 120 RPM x 6 min     Moist Heat Therapy   Number Minutes Moist Heat 15 Minutes   Moist Heat Location Shoulder     Electrical Stimulation   Electrical Stimulation Location left shoulder   Electrical Stimulation Action IFC   Electrical Stimulation Parameters 80-150hz    Electrical Stimulation Goals Pain     Ultrasound   Ultrasound Location left shoulder   Ultrasound Parameters 1.5w/cm2/100%/631mhz x698min   Ultrasound Goals Other (Comment)  with LLLDS into ER     Manual Therapy   Manual Therapy Passive ROM   Passive ROM PROM of L shouler into flexion, and IR / ER with holds at end range, capsular stretching and joint mobilization for all left shoulder motions to increase ROM                     PT Long Term Goals - 09/09/15 0948      PT LONG TERM GOAL #1   Title Independent with a HEP.   Time 4   Period Weeks   Status On-going     PT LONG TERM GOAL #2   Title Active left shoulder flexion to 150 degrees so the patient can easily reach overhead  Time 4   Period Weeks   Status On-going     PT LONG TERM GOAL #3   Title Active ER to 70 degrees+ to allow for easily donning/doffing of apparel   Time 4   Period Weeks   Status On-going     PT LONG TERM GOAL #4   Title Increase ROM so patient is able to reach behind back to L4 with left hand.   Time 4   Period Weeks   Status On-going     PT LONG TERM GOAL #5   Title Increase left shoulder strength to a solid 4+/5 to increase stability for performance of functional activities   Time 4   Period Weeks   Status On-going     PT LONG TERM GOAL #6   Title Perform ADL's with pain not > 3/10.   Time 4   Period Weeks   Status On-going               Plan - 09/11/15 0906    Clinical Impression Statement Patient progressing slowly with ROM due to ongoing tightness in left shouler. Patient tolerated treatment well today and has improved with ROM for ER after low load long duration stretching with US. Patient is going to MD in  september and order runs out prior to appt, sending follow up to MPT for re-cert today. Patient goals ongoing due to ROM and pain deficts.   Rehab Potential Good   PT Frequency 3x / week   PT Duration 4 weeks   PT Treatment/Interventions ADLs/Self Care Home Management;Electrical Stimulation;Ultrasound;Moist Heat;Cryotherapy;Patient/family education;Therapeutic exercise;Therapeutic activities;Manual techniques;Passive range of motion;Vasopneumatic Device;Dry needling   PT Next Visit Plan cont with aggressive PROM per MD new order to avoid a manipulation.  (MD. Rennis ChrisSupple 09/30/15)   Consulted and Agree with Plan of Care Patient      Patient will benefit from skilled therapeutic intervention in order to improve the following deficits and impairments:  Decreased range of motion, Decreased activity tolerance, Pain, Decreased strength  Visit Diagnosis: Pain in left shoulder  Stiffness of left shoulder, not elsewhere classified     Problem List There are no active problems to display for this patient.   DUNFORD, CHRISTINA P, PTA 09/11/2015, 9:15 AM   Cathie Hoopshristina Dunford, PTA 09/11/15 9:16 AM  [facility] [address] [place], [place], [address] Phone: [phone]   Fax:  [phone]  Name: Jamie Russell MRN: [id number] Date of Birth: Aug 13, 1960

## 2015-09-13 ENCOUNTER — Ambulatory Visit: Payer: BLUE CROSS/BLUE SHIELD | Admitting: *Deleted

## 2015-09-13 DIAGNOSIS — M25612 Stiffness of left shoulder, not elsewhere classified: Secondary | ICD-10-CM | POA: Diagnosis not present

## 2015-09-13 DIAGNOSIS — M25512 Pain in left shoulder: Secondary | ICD-10-CM | POA: Diagnosis not present

## 2015-09-13 NOTE — Therapy (Signed)
Fayrene HelperPinnacle Regional Hospital IncDomenick GongEvorn GoPhillMarland Ki TAG>red I. Dupon TEXTTAG>TEXTTAG>l PimpleLars Massoniberty Medialaudine MoutonMarland KitchenPhillis HaggisEvorn GongJarome MatinChiropodistGramercy Surgery Center Inc [facility] Fayrene HelperSsm St. Joseph Health Center-WentzvilleDomenick Gong Al PimpleLars MassonLiberty MediaClaudine MoutonMarland KitchenPhillis HaggisEvorn GongJarome MatinChiropodistMemorial Hospital  Title Increase ROM so patient is able to reach behind back to L4 with left hand.   Time 4   Period Weeks   Status On-going     PT LONG TERM GOAL #5   Title Increase left shoulder strength to a solid 4+/5 to increase stability for performance of functional activities   Time 4   Period Weeks   Status On-going     PT LONG TERM GOAL #6   Title Perform ADL's with pain not > 3/10.   Time 4   Period Weeks   Status On-going               Plan - 09/13/15 1316    Clinical Impression Statement Pt did fairly well today with Rx.  His ROM was about the same today as last Rx so ROM goals are Ongoing.    Rehab Potential Good   PT Frequency 3x / week   PT Duration 4 weeks   PT Treatment/Interventions ADLs/Self Care Home Management;Electrical Stimulation;Ultrasound;Moist Heat;Cryotherapy;Patient/family education;Therapeutic exercise;Therapeutic activities;Manual techniques;Passive range of motion;Vasopneumatic Device;Dry needling   PT Next Visit Plan cont with aggressive PROM per MD new order to avoid a manipulation.  (MD. Rennis ChrisSupple 09/30/15)   PT Home Exercise Plan HEP- ER , towel stretch   Consulted and Agree  with Plan of Care Patient      Patient will benefit from skilled therapeutic intervention in order to improve the following deficits and impairments:  Decreased range of motion, Decreased activity tolerance, Pain, Decreased strength  Visit Diagnosis: Pain in left shoulder  Stiffness of left shoulder, not elsewhere classified     Problem List There are no active problems to display for this patient.   Tyan Lasure,CHRIS, PTA 09/13/2015, 1:25 PM  [facility] [address] [place], [place], [address] Phone: [phone]   Fax:  [phone]  Name: Lamont SnowballHoward Shovlin MRN: [id number] Date of Birth: 1960/03/30

## 2015-09-16 ENCOUNTER — Ambulatory Visit: Payer: BLUE CROSS/BLUE SHIELD | Admitting: *Deleted

## 2015-09-16 DIAGNOSIS — M25612 Stiffness of left shoulder, not elsewhere classified: Secondary | ICD-10-CM | POA: Diagnosis not present

## 2015-09-16 DIAGNOSIS — M25512 Pain in left shoulder: Secondary | ICD-10-CM

## 2015-09-16 NOTE — Therapy (Signed)
[facility] [address] [place], [place], [address] Phone: [phone]   Fax:  [phone]  Physical Therapy Treatment  Patient Details  Name: Jamie SnowballHoward Russell MRN: [id number] Date of Birth: [birth year] Referring Provider: Ralene Batheracy Shuford PA-C.  Encounter Date: 09/16/2015      PT End of Session - 09/16/15 0832    Visit Number 11   Number of Visits 18   Date for PT Re-Evaluation 10/14/15   PT Start Time 0815   PT Stop Time 0915   PT Time Calculation (min) 60 min      No past medical history on file.  No past surgical history on file.  There were no vitals filed for this visit.      Subjective Assessment - 09/16/15 0829    Subjective LT shldr is a little better today, just sore   Patient Stated Goals Use my left shoulder without pain.   Currently in Pain? Yes   Pain Score 3    Pain Orientation Left   Pain Descriptors / Indicators Sore   Pain Onset More than a month ago   Pain Frequency Intermittent                         [facility] Adult PT Treatment/Exercise - 09/16/15 0001      Shoulder Exercises: Pulleys   Flexion Other (comment)  5min   Other Pulley Exercises LUE ranger flexion and ER stretching x 5 mins     Shoulder Exercises: ROM/Strengthening   UBE (Upper Arm Bike) 120 RPM x 8 min     Moist Heat Therapy   Number Minutes Moist Heat 15 Minutes   Moist Heat Location Shoulder     Electrical Stimulation   Electrical Stimulation Location L shoulder IFC x15 mins80-150hz    Electrical Stimulation Goals Pain     Manual Therapy   Manual Therapy Passive ROM   Passive ROM PROM of L shouler into flexion, and IR / ER with holds at end range, capsular stretching and joint mobilization for all left shoulder motions to increase ROM                     PT Long Term Goals - 09/09/15 0948      PT LONG TERM GOAL #1   Title Independent with a HEP.   Time 4   Period Weeks   Status On-going     PT  LONG TERM GOAL #2   Title Active left shoulder flexion to 150 degrees so the patient can easily reach overhead   Time 4   Period Weeks   Status On-going     PT LONG TERM GOAL #3   Title Active ER to 70 degrees+ to allow for easily donning/doffing of apparel   Time 4   Period Weeks   Status On-going     PT LONG TERM GOAL #4   Title Increase ROM so patient is able to reach behind back to L4 with left hand.   Time 4   Period Weeks   Status On-going     PT LONG TERM GOAL #5   Title Increase left shoulder strength to a solid 4+/5 to increase stability for performance of functional activities   Time 4   Period Weeks   Status On-going     PT LONG TERM GOAL #6   Title Perform ADL's with pain not > 3/10.   Time 4   Period Weeks   Status  On-going               Plan - 09/16/15 0835    Clinical Impression Statement Pt has been a little sick over the weekend from possible vertigo and was unable to perform many exs. He did fairly well with exs and manual techniques to increase ROM. He continues to have ROM deficits in LT shldr with ER being the greatest.   Rehab Potential Good   PT Frequency 3x / week   PT Duration 4 weeks   PT Treatment/Interventions ADLs/Self Care Home Management;Electrical Stimulation;Ultrasound;Moist Heat;Cryotherapy;Patient/family education;Therapeutic exercise;Therapeutic activities;Manual techniques;Passive range of motion;Vasopneumatic Device;Dry needling   PT Next Visit Plan cont with aggressive PROM per MD new order to avoid a manipulation.  (MD. Rennis Chris 09/30/15)   PT Home Exercise Plan HEP- ER , towel stretch   Consulted and Agree with Plan of Care Patient      Patient will benefit from skilled therapeutic intervention in order to improve the following deficits and impairments:  Decreased range of motion, Decreased activity tolerance, Pain, Decreased strength  Visit Diagnosis: Pain in left shoulder  Stiffness of left shoulder, not elsewhere  classified     Problem List There are no active problems to display for this patient.   Ivanka Kirshner,CHRIS, PTA 09/16/2015, 9:45 AM  [facility] [address] [place], [place], [address] Phone: [phone]   Fax:  [phone]  Name: Jamie Russell MRN: [id number] Date of Birth: 1960-08-26

## 2015-09-18 ENCOUNTER — Encounter: Payer: Self-pay | Admitting: Physical Therapy

## 2015-09-18 ENCOUNTER — Ambulatory Visit: Payer: BLUE CROSS/BLUE SHIELD | Admitting: Physical Therapy

## 2015-09-18 DIAGNOSIS — M25512 Pain in left shoulder: Secondary | ICD-10-CM

## 2015-09-18 DIAGNOSIS — M25612 Stiffness of left shoulder, not elsewhere classified: Secondary | ICD-10-CM

## 2015-09-18 NOTE — Therapy (Signed)
[facility] [address] [place], [place], [address] Phone: [phone]   Fax:  [phone]  Physical Therapy Treatment  Patient Details  Name: Jamie SnowballHoward Russell MRN: [id number] Date of Birth: 1960/09/06 Referring Provider: Ralene Batheracy Shuford PA-C.  Encounter Date: 09/18/2015      PT End of Session - 09/18/15 0933    Visit Number 12   Number of Visits 18   Date for PT Re-Evaluation 10/14/15   PT Start Time 0900   PT Stop Time 0950   PT Time Calculation (min) 50 min   Activity Tolerance Patient tolerated treatment well   Behavior During Therapy [facility] for tasks assessed/performed      History reviewed. No pertinent past medical history.  History reviewed. No pertinent surgical history.  There were no vitals filed for this visit.      Subjective Assessment - 09/18/15 0902    Subjective Lt shoulder feels "about the same", just a little sore   Patient Stated Goals Use my left shoulder without pain.   Currently in Pain? Yes   Pain Score 2    Pain Location Shoulder   Pain Orientation Left   Pain Descriptors / Indicators Sore   Pain Onset More than a month ago                         [facility] Treatment/Exercise - 09/18/15 0001      Shoulder Exercises: Pulleys   Flexion 3 minutes   ABduction 3 minutes     Shoulder Exercises: ROM/Strengthening   UBE (Upper Arm Bike) 120 RPM x 6 mins     Moist Heat Therapy   Number Minutes Moist Heat 15 Minutes   Moist Heat Location Shoulder     Electrical Stimulation   Electrical Stimulation Location Lt shoulder IFC   Electrical Stimulation Parameters 80-150 Hz   Electrical Stimulation Goals Pain     Manual Therapy   Manual Therapy Joint mobilization;Soft tissue mobilization   Joint Mobilization to improve ER, IR and abduction   Soft tissue mobilization Lt subscap and pectorals   Passive ROM PROM with holds at end range to improve ER and IR                      PT Long Term Goals - 09/18/15 0935      PT LONG TERM GOAL #1   Title Independent with a HEP.   Status On-going     PT LONG TERM GOAL #2   Title Active left shoulder flexion to 150 degrees so the patient can easily reach overhead   Status On-going     PT LONG TERM GOAL #3   Title Active ER to 70 degrees+ to allow for easily donning/doffing of apparel   Status On-going     PT LONG TERM GOAL #4   Title Increase ROM so patient is able to reach behind back to L4 with left hand.   Status On-going     PT LONG TERM GOAL #5   Title Increase left shoulder strength to a solid 4+/5 to increase stability for performance of functional activities   Status On-going     PT LONG TERM GOAL #6   Title Perform ADL's with pain not > 3/10.   Status On-going               Plan - 09/18/15 0934    Clinical Impression Statement Pt progressing well with ROM, continues  with a lot of pain with stretching into ER and IR, pain with manual but pt is motivated to improve   Rehab Potential Good   PT Frequency 3x / week   PT Duration 4 weeks   PT Treatment/Interventions ADLs/Self Care Home Management;Electrical Stimulation;Ultrasound;Moist Heat;Cryotherapy;Patient/family education;Therapeutic exercise;Therapeutic activities;Manual techniques;Passive range of motion;Vasopneumatic Device;Dry needling   PT Next Visit Plan cont with aggressive PROM per MD new order to avoid a manipulation.  (MD. Rennis Chris 09/30/15)   Consulted and Agree with Plan of Care Patient      Patient will benefit from skilled therapeutic intervention in order to improve the following deficits and impairments:  Decreased range of motion, Decreased activity tolerance, Pain, Decreased strength  Visit Diagnosis: Pain in left shoulder  Stiffness of left shoulder, not elsewhere classified     Problem List There are no active problems to display for this patient.   Reggy Eye, PT,  DPT 09/18/2015, 9:36 AM  [facility] [address] [place], [place], [address] Phone: [phone]   Fax:  [phone]  Name: Jamie Russell MRN: [id number] Date of Birth: 11-20-1960

## 2015-09-20 ENCOUNTER — Ambulatory Visit: Payer: BLUE CROSS/BLUE SHIELD | Attending: Orthopedic Surgery | Admitting: *Deleted

## 2015-09-20 DIAGNOSIS — M25612 Stiffness of left shoulder, not elsewhere classified: Secondary | ICD-10-CM | POA: Diagnosis not present

## 2015-09-20 DIAGNOSIS — M25512 Pain in left shoulder: Secondary | ICD-10-CM | POA: Diagnosis not present

## 2015-09-20 NOTE — Therapy (Signed)
[facility] [address] [place], [place], [address] Phone: [phone]   Fax:  [phone]  Physical Therapy Treatment  Patient Details  Name: Jamie Russell MRN: [id number] Date of Birth: 11-08-1960 Referring Provider: Ralene Bathe PA-C.  Encounter Date: 09/20/2015      PT End of Session - 09/20/15 0915    Visit Number 13   Number of Visits 18   Date for PT Re-Evaluation 10/14/15   PT Start Time 0815   PT Stop Time 0915   PT Time Calculation (min) 60 min      No past medical history on file.  No past surgical history on file.  There were no vitals filed for this visit.      Subjective Assessment - 09/20/15 0826    Subjective Lt shoulder feels "about the same", just a little sore   Patient Stated Goals Use my left shoulder without pain.   Currently in Pain? Yes   Pain Score 2    Pain Location Shoulder   Pain Orientation Left   Pain Descriptors / Indicators Sore   Pain Onset More than a month ago   Pain Frequency Intermittent            [facility] PT Assessment - 09/20/15 0001      ROM / Strength   AROM / PROM / Strength PROM     AROM   AROM Assessment Site Shoulder   Left Shoulder Flexion 135 Degrees   Left Shoulder External Rotation 25 Degrees                     [facility] Adult PT Treatment/Exercise - 09/20/15 0001      Shoulder Exercises: Pulleys   Flexion 3 minutes   ABduction 3 minutes   Other Pulley Exercises LUE ranger flexion and ER stretching x 5 mins     Shoulder Exercises: ROM/Strengthening   UBE (Upper Arm Bike) 120 RPM x 8 min     Moist Heat Therapy   Number Minutes Moist Heat 15 Minutes   Moist Heat Location Shoulder     Electrical Stimulation   Electrical Stimulation Location L shoulder IFC x15 mins80-150hz    Electrical Stimulation Goals Pain     Manual Therapy   Manual Therapy Joint mobilization;Soft tissue mobilization   Joint Mobilization to improve ER, IR and abduction    Soft tissue mobilization Lt subscap and pectorals   Passive ROM PROM with holds at end range to improve ER and IR                     PT Long Term Goals - 09/18/15 0935      PT LONG TERM GOAL #1   Title Independent with a HEP.   Status On-going     PT LONG TERM GOAL #2   Title Active left shoulder flexion to 150 degrees so the patient can easily reach overhead   Status On-going     PT LONG TERM GOAL #3   Title Active ER to 70 degrees+ to allow for easily donning/doffing of apparel   Status On-going     PT LONG TERM GOAL #4   Title Increase ROM so patient is able to reach behind back to L4 with left hand.   Status On-going     PT LONG TERM GOAL #5   Title Increase left shoulder strength to a solid 4+/5 to increase stability for performance of functional activities   Status On-going  PT LONG TERM GOAL #6   Title Perform ADL's with pain not > 3/10.   Status On-going               Plan - 09/20/15 0915    Clinical Impression Statement Pt did fairly wellwith Rx today and was able to increase ER ROM to 25 degrees, but flexion was about the same at 135 degrees. He feels it is progressing it just is slow. Goals are ongoing   Rehab Potential Good   PT Frequency 3x / week   PT Duration 4 weeks   PT Treatment/Interventions ADLs/Self Care Home Management;Electrical Stimulation;Ultrasound;Moist Heat;Cryotherapy;Patient/family education;Therapeutic exercise;Therapeutic activities;Manual techniques;Passive range of motion;Vasopneumatic Device;Dry needling   PT Next Visit Plan cont with aggressive PROM per MD new order to avoid a manipulation.  (MD. Rennis ChrisSupple 09/30/15)   PT Home Exercise Plan HEP- ER , towel stretch   Consulted and Agree with Plan of Care Patient      Patient will benefit from skilled therapeutic intervention in order to improve the following deficits and impairments:  Decreased range of motion, Decreased activity tolerance, Pain, Decreased  strength  Visit Diagnosis: Pain in left shoulder  Stiffness of left shoulder, not elsewhere classified     Problem List There are no active problems to display for this patient.   RAMSEUR,CHRIS, PTA 09/20/2015, 9:38 AM  [facility] [address] [place], [place], [address] Phone: [phone]   Fax:  [phone]  Name: Lamont SnowballHoward Depass MRN: [id number] Date of Birth: 1960/10/29

## 2015-09-24 ENCOUNTER — Ambulatory Visit: Payer: BLUE CROSS/BLUE SHIELD | Admitting: *Deleted

## 2015-09-24 DIAGNOSIS — M25612 Stiffness of left shoulder, not elsewhere classified: Secondary | ICD-10-CM

## 2015-09-24 DIAGNOSIS — M25512 Pain in left shoulder: Secondary | ICD-10-CM

## 2015-09-24 NOTE — Therapy (Signed)
[facility] [address] [place], [place], [address] Phone: [phone]   Fax:  [phone]  Physical Therapy Treatment  Patient Details  Name: Jamie Russell MRN: [id number] Date of Birth: 1960/04/06 Referring Provider: Ralene Bathe PA-C.  Encounter Date: 09/24/2015      PT End of Session - 09/24/15 0816    Visit Number 14   Number of Visits 18   Date for PT Re-Evaluation 10/14/15   PT Start Time 0815   PT Stop Time 0914   PT Time Calculation (min) 59 min      No past medical history on file.  No past surgical history on file.  There were no vitals filed for this visit.      Subjective Assessment - 09/24/15 0814    Subjective Lt shldr sore all weekend    Patient Stated Goals Use my left shoulder without pain.   Currently in Pain? Yes   Pain Score 4    Pain Location Shoulder   Pain Orientation Left   Pain Descriptors / Indicators Sore   Pain Onset More than a month ago   Pain Frequency Intermittent                         [facility] Adult PT Treatment/Exercise - 09/24/15 0001      Shoulder Exercises: Pulleys   Flexion 3 minutes   ABduction 3 minutes   Other Pulley Exercises LUE ranger flexion and ER stretching x 5 mins     Shoulder Exercises: ROM/Strengthening   UBE (Upper Arm Bike) 120 RPM x 8 min     Electrical Stimulation   Electrical Stimulation Location L shoulder IFC x15 mins80-150hz    Electrical Stimulation Goals Pain     Manual Therapy   Manual Therapy Joint mobilization;Soft tissue mobilization   Joint Mobilization to improve ER, IR and abduction   Passive ROM PROM with holds at end range to improve ER and IR                     PT Long Term Goals - 09/18/15 0935      PT LONG TERM GOAL #1   Title Independent with a HEP.   Status On-going     PT LONG TERM GOAL #2   Title Active left shoulder flexion to 150 degrees so the patient can easily reach overhead   Status  On-going     PT LONG TERM GOAL #3   Title Active ER to 70 degrees+ to allow for easily donning/doffing of apparel   Status On-going     PT LONG TERM GOAL #4   Title Increase ROM so patient is able to reach behind back to L4 with left hand.   Status On-going     PT LONG TERM GOAL #5   Title Increase left shoulder strength to a solid 4+/5 to increase stability for performance of functional activities   Status On-going     PT LONG TERM GOAL #6   Title Perform ADL's with pain not > 3/10.   Status On-going               Plan - 09/24/15 0818    Clinical Impression Statement Pt arrived to clinic with increased soreness in LT shldr 4/10. His PROM was 140 for Flexion,90  IR, and 30 degrees for ER. Pt will F/U with MD next week and we will send a progress note after next visit  Rehab Potential Good   PT Frequency 3x / week   PT Duration 4 weeks   PT Treatment/Interventions ADLs/Self Care Home Management;Electrical Stimulation;Ultrasound;Moist Heat;Cryotherapy;Patient/family education;Therapeutic exercise;Therapeutic activities;Manual techniques;Passive range of motion;Vasopneumatic Device;Dry needling   PT Next Visit Plan cont with aggressive PROM per MD new order to avoid a manipulation.  (MD. Rennis ChrisSupple 09/30/15)     MD note next visit   PT Home Exercise Plan HEP- ER , towel stretch   Consulted and Agree with Plan of Care Patient      Patient will benefit from skilled therapeutic intervention in order to improve the following deficits and impairments:  Decreased range of motion, Decreased activity tolerance, Pain, Decreased strength  Visit Diagnosis: Pain in left shoulder  Stiffness of left shoulder, not elsewhere classified     Problem List There are no active problems to display for this patient.   Pakou Rainbow,CHRIS, PTA 09/24/2015, 9:48 AM  [facility] [address] [place], [place], [address] Phone: [phone]   Fax:   [phone]  Name: Lamont SnowballHoward Kraft MRN: [id number] Date of Birth: [birth year]

## 2015-09-26 ENCOUNTER — Ambulatory Visit: Payer: BLUE CROSS/BLUE SHIELD | Admitting: *Deleted

## 2015-09-26 DIAGNOSIS — M25512 Pain in left shoulder: Secondary | ICD-10-CM | POA: Diagnosis not present

## 2015-09-26 DIAGNOSIS — M25612 Stiffness of left shoulder, not elsewhere classified: Secondary | ICD-10-CM | POA: Diagnosis not present

## 2015-09-26 NOTE — Therapy (Signed)
[facility] [address] [place], [place], [address] Phone: [phone]   Fax:  [phone]  Physical Therapy Treatment  Patient Details  Name: Jamie SnowballHoward Russell MRN: [id number] Date of Birth: 11-29-1960 Referring Provider: Ralene Batheracy Shuford PA-C.  Encounter Date: 09/26/2015      PT End of Session - 09/26/15 0824    Visit Number 15   Number of Visits 18   Date for PT Re-Evaluation 10/14/15   PT Start Time 0815   PT Stop Time 0914   PT Time Calculation (min) 59 min      No past medical history on file.  No past surgical history on file.  There were no vitals filed for this visit.      Subjective Assessment - 09/26/15 0822    Subjective LT shoulder is still tight  and tender. To MD Monday   Patient Stated Goals Use my left shoulder without pain.   Currently in Pain? Yes   Pain Score 3    Pain Location Shoulder   Pain Orientation Left   Pain Descriptors / Indicators Sore   Pain Onset More than a month ago   Pain Frequency Intermittent   Aggravating Factors  stretching   Pain Relieving Factors rest, heat, estim                         [facility] Adult PT Treatment/Exercise - 09/26/15 0001      Shoulder Exercises: Pulleys   Flexion 3 minutes   Other Pulley Exercises LUE ranger flexion and ER stretching x 5 mins     Shoulder Exercises: ROM/Strengthening   UBE (Upper Arm Bike) 90 RPM RPM x 8 min     Moist Heat Therapy   Number Minutes Moist Heat 15 Minutes   Moist Heat Location Shoulder     Electrical Stimulation   Electrical Stimulation Location L shoulder IFC x15 mins80-150hz    Electrical Stimulation Goals Pain     Manual Therapy   Manual Therapy Joint mobilization;Soft tissue mobilization   Joint Mobilization to improve ER, IR and abduction   Passive ROM PROM with holds at end range to improve ER and IR                                                                                      3 mins  pulleysAbduction                PT Long Term Goals - 09/18/15 0935      PT LONG TERM GOAL #1   Title Independent with a HEP.   Status On-going     PT LONG TERM GOAL #2   Title Active left shoulder flexion to 150 degrees so the patient can easily reach overhead   Status On-going     PT LONG TERM GOAL #3   Title Active ER to 70 degrees+ to allow for easily donning/doffing of apparel   Status On-going     PT LONG TERM GOAL #4   Title Increase ROM so patient is able to reach behind back to L4 with left hand.   Status On-going  PT LONG TERM GOAL #5   Title Increase left shoulder strength to a solid 4+/5 to increase stability for performance of functional activities   Status On-going     PT LONG TERM GOAL #6   Title Perform ADL's with pain not > 3/10.   Status On-going               Plan - 09/26/15 4098    Clinical Impression Statement Pt arrived to clinic with LT shldr still being sore and tight. He feels PT has helped with increased ROM, but he still can't rotate it out or reach behind his back. Standing AROM today was 120 degrees of flexion and PROM to 130, reaching behind his back to LT SIJ, ER to 25- 30 degrees.  Send MD note   Rehab Potential Good   PT Frequency 3x / week   PT Duration 4 weeks   PT Treatment/Interventions ADLs/Self Care Home Management;Electrical Stimulation;Ultrasound;Moist Heat;Cryotherapy;Patient/family education;Therapeutic exercise;Therapeutic activities;Manual techniques;Passive range of motion;Vasopneumatic Device;Dry needling   PT Next Visit Plan cont with aggressive PROM per MD new order to avoid a manipulation.  (MD. Rennis Chris 09/30/15)     MD note sent today   PT Home Exercise Plan HEP- ER , towel stretch   Consulted and Agree with Plan of Care Patient      Patient will benefit from skilled therapeutic intervention in order to improve the following deficits and impairments:  Decreased range of motion, Decreased activity  tolerance, Pain, Decreased strength  Visit Diagnosis: Pain in left shoulder  Stiffness of left shoulder, not elsewhere classified     Problem List There are no active problems to display for this patient.   RAMSEUR,CHRIS, PTA 09/26/2015, 9:14 AM [place] MPT [facility] [address] [place], [place], [address] Phone: [phone]   Fax:  [phone]  Name: Jamie Russell MRN: [id number] Date of Birth: September 28, 1960

## 2015-09-30 DIAGNOSIS — M7502 Adhesive capsulitis of left shoulder: Secondary | ICD-10-CM | POA: Diagnosis not present

## 2015-10-08 DIAGNOSIS — M24112 Other articular cartilage disorders, left shoulder: Secondary | ICD-10-CM | POA: Diagnosis not present

## 2015-10-08 DIAGNOSIS — M7542 Impingement syndrome of left shoulder: Secondary | ICD-10-CM | POA: Diagnosis not present

## 2015-10-08 DIAGNOSIS — M659 Synovitis and tenosynovitis, unspecified: Secondary | ICD-10-CM | POA: Diagnosis not present

## 2015-10-08 DIAGNOSIS — G8918 Other acute postprocedural pain: Secondary | ICD-10-CM | POA: Diagnosis not present

## 2015-10-08 DIAGNOSIS — M7502 Adhesive capsulitis of left shoulder: Secondary | ICD-10-CM | POA: Diagnosis not present

## 2015-10-08 DIAGNOSIS — M19012 Primary osteoarthritis, left shoulder: Secondary | ICD-10-CM | POA: Diagnosis not present

## 2015-10-08 DIAGNOSIS — S43432A Superior glenoid labrum lesion of left shoulder, initial encounter: Secondary | ICD-10-CM | POA: Diagnosis not present

## 2015-10-09 ENCOUNTER — Ambulatory Visit: Payer: BLUE CROSS/BLUE SHIELD | Admitting: Physical Therapy

## 2015-10-09 DIAGNOSIS — M25612 Stiffness of left shoulder, not elsewhere classified: Secondary | ICD-10-CM

## 2015-10-09 DIAGNOSIS — M25512 Pain in left shoulder: Secondary | ICD-10-CM | POA: Diagnosis not present

## 2015-10-09 NOTE — Therapy (Signed)
[facility] [address] [place], [place], [address] Phone: [phone]   Fax:  [phone]  Physical Therapy Treatment  Patient Details  Name: Jamie SnowballHoward Russell MRN: [id number] Date of Birth: Mar 30, 1960 Referring Provider: Ralene Batheracy Shuford PA-C.  Encounter Date: 10/09/2015      PT End of Session - 10/09/15 1512    Visit Number 16   Number of Visits 18   Date for PT Re-Evaluation 10/14/15   PT Start Time 0100   PT Stop Time 0151   PT Time Calculation (min) 51 min      No past medical history on file.  No past surgical history on file.  There were no vitals filed for this visit.      Subjective Assessment - 10/09/15 1508    Subjective The doctor aid he removed a lot of scar tissue.   Patient Stated Goals Use my left shoulder without pain.   Pain Score 6    Pain Location Shoulder   Pain Orientation Left   Pain Descriptors / Indicators Sore;Throbbing   Pain Onset Yesterday   Pain Frequency Constant      Treatment:  PROM x 23 minutes in supine into left shoulder flexion and ER f/b medium vasopneumatic on medium while seated x 15 minutes.  Excellent job today.                                PT Long Term Goals - 10/09/15 1506      PT LONG TERM GOAL #1   Title Independent with a HEP.   Time 4   Period Weeks   Status New     PT LONG TERM GOAL #2   Title Active left shoulder flexion to 150 degrees so the patient can easily reach overhead   Time 4   Period Weeks   Status New     PT LONG TERM GOAL #3   Title Active ER to 70 degrees+ to allow for easily donning/doffing of apparel   Time 4   Period Weeks   Status New     PT LONG TERM GOAL #4   Title Increase ROM so patient is able to reach behind back to L4 with left hand.   Time 4   Period Weeks   Status New     PT LONG TERM GOAL #5   Title Increase left shoulder strength to a solid 4+/5 to increase stability for performance of functional  activities   Time 4   Period Weeks   Status New     PT LONG TERM GOAL #6   Title Perform ADL's with pain not > 3/10.   Time 4   Period Weeks   Status On-going               Plan - 10/09/15 1502    Clinical Impression Statement The patient underwent a left shoulder MUA, SAD and DCR yesterday, 10/08/15.  His post-surgicaldressing is intact and is to be removed in 72 hours.  Pre-treatment today his left shoulder flexion= 95 degrees and ER= -10 degrees from the neutral position.  These measurements improved significantly after a session of PROM today.   Rehab Potential Good   PT Frequency 3x / week   PT Duration 4 weeks   PT Treatment/Interventions ADLs/Self Care Home Management;Electrical Stimulation;Ultrasound;Moist Heat;Cryotherapy;Patient/family education;Therapeutic exercise;Therapeutic activities;Manual techniques;Passive range of motion;Vasopneumatic Device   PT Next Visit Plan Begin with PROM to patient's  left shoulder f/b Vasopneumatic.   Consulted and Agree with Plan of Care Patient      Patient will benefit from skilled therapeutic intervention in order to improve the following deficits and impairments:  Decreased range of motion, Decreased activity tolerance, Pain, Decreased strength  Visit Diagnosis: Pain in left shoulder  Stiffness of left shoulder, not elsewhere classified     Problem List There are no active problems to display for this patient.   Jamie Russell, [place] 10/09/2015, 3:13 PM  [facility] [address] [place], [place], [address] Phone: [phone]   Fax:  [phone]  Name: Jamie Russell MRN: [id number] Date of Birth: 05-21-1960

## 2015-10-10 ENCOUNTER — Ambulatory Visit: Payer: BLUE CROSS/BLUE SHIELD | Admitting: *Deleted

## 2015-10-10 DIAGNOSIS — M25512 Pain in left shoulder: Secondary | ICD-10-CM | POA: Diagnosis not present

## 2015-10-10 DIAGNOSIS — M25612 Stiffness of left shoulder, not elsewhere classified: Secondary | ICD-10-CM

## 2015-10-10 NOTE — Therapy (Signed)
[facility] [address] [place], [place], [address] Phone: [phone]   Fax:  [phone]  Physical Therapy Treatment  Patient Details  Name: Jamie Russell MRN: [id number] Date of Birth: 12-16-1960 Referring Provider: Ralene Bathe PA-C.  Encounter Date: 10/10/2015      PT End of Session - 10/10/15 1148    Visit Number 17   Number of Visits 20   Date for PT Re-Evaluation 10/15/15   PT Start Time 1030   PT Stop Time 1119   PT Time Calculation (min) 49 min      No past medical history on file.  No past surgical history on file.  There were no vitals filed for this visit.      Subjective Assessment - 10/10/15 1112    Subjective Trying to keep LT arm moving   Patient Stated Goals Use my left shoulder without pain.   Currently in Pain? Yes   Pain Score 3    Pain Location Shoulder   Pain Orientation Left   Pain Descriptors / Indicators Aching;Sore   Pain Onset In the past 7 days                         [facility] Adult PT Treatment/Exercise - 10/10/15 0001      Shoulder Exercises: Pulleys   Flexion --  5 mins     Modalities   Modalities Vasopneumatic     Cryotherapy   Number Minutes Cryotherapy --   Cryotherapy Location --     Vasopneumatic   Number Minutes Vasopneumatic  15 minutes   Vasopnuematic Location  Shoulder   Vasopneumatic Pressure Low   Vasopneumatic Temperature  36     Manual Therapy   Manual Therapy Passive ROM   Passive ROM PROM with holds at end range to improve all motions                     PT Long Term Goals - 10/09/15 1506      PT LONG TERM GOAL #1   Title Independent with a HEP.   Time 4   Period Weeks   Status New     PT LONG TERM GOAL #2   Title Active left shoulder flexion to 150 degrees so the patient can easily reach overhead   Time 4   Period Weeks   Status New     PT LONG TERM GOAL #3   Title Active ER to 70 degrees+ to allow for easily  donning/doffing of apparel   Time 4   Period Weeks   Status New     PT LONG TERM GOAL #4   Title Increase ROM so patient is able to reach behind back to L4 with left hand.   Time 4   Period Weeks   Status New     PT LONG TERM GOAL #5   Title Increase left shoulder strength to a solid 4+/5 to increase stability for performance of functional activities   Time 4   Period Weeks   Status New     PT LONG TERM GOAL #6   Title Perform ADL's with pain not > 3/10.   Time 4   Period Weeks   Status On-going               Plan - 10/10/15 1150    Clinical Impression Statement Pt underwent LT Shldr MUA 10-08-15. He was still very guarded and was holding his LT  Arm in IR at his abdomen. He did well with Rx today and had increasded ROM end of Rx. Patient needs verbal cues to relax throughout  Rx.   PT Frequency 3x / week   PT Duration 4 weeks   PT Treatment/Interventions ADLs/Self Care Home Management;Electrical Stimulation;Ultrasound;Moist Heat;Cryotherapy;Patient/family education;Therapeutic exercise;Therapeutic activities;Manual techniques;Passive range of motion;Vasopneumatic Device   PT Next Visit Plan Begin with PROM to patient's left shoulder f/b Vasopneumatic.   PT Home Exercise Plan HEP- ER , towel stretch   Consulted and Agree with Plan of Care Patient      Patient will benefit from skilled therapeutic intervention in order to improve the following deficits and impairments:  Decreased range of motion, Decreased activity tolerance, Pain, Decreased strength  Visit Diagnosis: Pain in left shoulder  Stiffness of left shoulder, not elsewhere classified     Problem List There are no active problems to display for this patient.   RAMSEUR,CHRIS, PTA 10/10/2015, 12:11 PM  [facility] [address] [place], [place], [address] Phone: [phone]   Fax:  [phone]  Name: Lamont SnowballHoward Geffert MRN: [id number] Date of Birth:  Mar 02, 1960

## 2015-10-11 ENCOUNTER — Ambulatory Visit: Payer: BLUE CROSS/BLUE SHIELD | Admitting: *Deleted

## 2015-10-11 DIAGNOSIS — M25612 Stiffness of left shoulder, not elsewhere classified: Secondary | ICD-10-CM

## 2015-10-11 DIAGNOSIS — M25512 Pain in left shoulder: Secondary | ICD-10-CM | POA: Diagnosis not present

## 2015-10-11 NOTE — Therapy (Signed)
[facility] [address] [place], [place], [address] Phone: [phone]   Fax:  [phone]  Physical Therapy Treatment  Patient Details  Name: Jamie Russell MRN: [id number] Date of Birth: [birth year] Referring Provider: Ralene Bathe PA-C.  Encounter Date: 10/11/2015      PT End of Session - 10/11/15 0917    Visit Number 18   Number of Visits 20   Date for PT Re-Evaluation 10/15/15   PT Start Time 0900   PT Stop Time 0951   PT Time Calculation (min) 51 min   Activity Tolerance Patient tolerated treatment well   Behavior During Therapy [facility] for tasks assessed/performed      No past medical history on file.  No past surgical history on file.  There were no vitals filed for this visit.      Subjective Assessment - 10/11/15 0902    Subjective Trying to keep LT arm moving   Patient Stated Goals Use my left shoulder without pain.   Currently in Pain? Yes   Pain Score 3    Pain Location Shoulder   Pain Orientation Left   Pain Descriptors / Indicators Aching;Sore   Pain Onset In the past 7 days   Pain Frequency Constant                         [facility] Adult PT Treatment/Exercise - 10/11/15 0001      Shoulder Exercises: Pulleys   Flexion --  5 mins   Other Pulley Exercises supine pulleys 5 mins  Pt did better with sitting pulleys     Electrical Stimulation   Electrical Stimulation Location L shoulder IFC x15 mins80-150hz    Electrical Stimulation Goals Pain     Vasopneumatic   Number Minutes Vasopneumatic  15 minutes   Vasopnuematic Location  Shoulder   Vasopneumatic Pressure Low   Vasopneumatic Temperature  36     Manual Therapy   Manual Therapy Passive ROM   Passive ROM PROM with holds at end range to improve all motions                     PT Long Term Goals - 10/09/15 1506      PT LONG TERM GOAL #1   Title Independent with a HEP.   Time 4   Period Weeks   Status New     PT  LONG TERM GOAL #2   Title Active left shoulder flexion to 150 degrees so the patient can easily reach overhead   Time 4   Period Weeks   Status New     PT LONG TERM GOAL #3   Title Active ER to 70 degrees+ to allow for easily donning/doffing of apparel   Time 4   Period Weeks   Status New     PT LONG TERM GOAL #4   Title Increase ROM so patient is able to reach behind back to L4 with left hand.   Time 4   Period Weeks   Status New     PT LONG TERM GOAL #5   Title Increase left shoulder strength to a solid 4+/5 to increase stability for performance of functional activities   Time 4   Period Weeks   Status New     PT LONG TERM GOAL #6   Title Perform ADL's with pain not > 3/10.   Time 4   Period Weeks   Status On-going  Plan - 10/11/15 0943    Clinical Impression Statement LT shldr MUA 10-08-15. Pt did well with Rx today and is progressing with ROM. His PROM for ER was better, but still only to 30 degrees, and IR to 60 degrees. He did better with sitting pulleys than supine pulleys   Rehab Potential Good   PT Frequency 3x / week   PT Duration 4 weeks   PT Treatment/Interventions ADLs/Self Care Home Management;Electrical Stimulation;Ultrasound;Moist Heat;Cryotherapy;Patient/family education;Therapeutic exercise;Therapeutic activities;Manual techniques;Passive range of motion;Vasopneumatic Device   PT Next Visit Plan Begin with PROM to patient's left shoulder f/b Vasopneumatic.   PT Home Exercise Plan HEP- ER , towel stretch   Consulted and Agree with Plan of Care Patient      Patient will benefit from skilled therapeutic intervention in order to improve the following deficits and impairments:  Decreased range of motion, Decreased activity tolerance, Pain, Decreased strength  Visit Diagnosis: Pain in left shoulder  Stiffness of left shoulder, not elsewhere classified     Problem List There are no active problems to display for this  patient.   Wenceslaus Gist,CHRIS, PTA 10/11/2015, 1:31 PM  [facility] [address] [place], [place], [address] Phone: [phone]   Fax:  [phone]  Name: Jamie SnowballHoward Russell MRN: [id number] Date of Birth: January 22, 1960

## 2015-10-14 ENCOUNTER — Ambulatory Visit: Payer: BLUE CROSS/BLUE SHIELD | Admitting: Physical Therapy

## 2015-10-14 DIAGNOSIS — M25612 Stiffness of left shoulder, not elsewhere classified: Secondary | ICD-10-CM

## 2015-10-14 DIAGNOSIS — M25512 Pain in left shoulder: Secondary | ICD-10-CM | POA: Diagnosis not present

## 2015-10-14 NOTE — Therapy (Signed)
[facility] [address] [place], [place], [address] Phone: [phone]   Fax:  [phone]  Physical Therapy Treatment  Patient Details  Name: Jamie Russell MRN: [id number] Date of Birth: 1960/03/02 Referring Provider: Ralene Bathe PA-C.  Encounter Date: 10/14/2015      PT End of Session - 10/14/15 0905    Visit Number 19   Number of Visits 20   Date for PT Re-Evaluation 10/15/15   PT Start Time 0902   PT Stop Time 0958   PT Time Calculation (min) 56 min   Activity Tolerance Patient tolerated treatment well   Behavior During Therapy [facility] for tasks assessed/performed      No past medical history on file.  No past surgical history on file.  There were no vitals filed for this visit.      Subjective Assessment - 10/14/15 0906    Subjective Patient reports he did well after surgery until Friday and Saturday night. He tried to go back to his bed from recliner and could only sleep an hour and then it was "killing" him. He took a pain pill Saturday night.  He was able to sleep in bed last night with some pain.   Patient Stated Goals Use my left shoulder without pain.   Currently in Pain? Yes   Pain Score 2    Pain Location Shoulder   Pain Orientation Left   Pain Descriptors / Indicators Aching;Sore   Pain Onset In the past 7 days                         [facility] Adult PT Treatment/Exercise - 10/14/15 0001      Shoulder Exercises: Supine   Other Supine Exercises thoracic extension over rolled towel     Shoulder Exercises: Pulleys   Flexion Other (comment)  7 min     Moist Heat Therapy   Number Minutes Moist Heat 15 Minutes   Moist Heat Location Shoulder     Electrical Stimulation   Electrical Stimulation Location L shoulder IFC x15 [id number]    Electrical Stimulation Goals Pain     Manual Therapy   Manual Therapy Soft tissue mobilization;Joint mobilization;Scapular mobilization;Passive ROM   Joint  Mobilization L GH gd III/IV for IR/ER; mid/upper thoracic PA mobs Gd III   Soft tissue mobilization to L subscap and lateral rotators; pecs   Scapular Mobilization all planes   Passive ROM PROM with holds at end range to improve all motions                PT Education - 10/14/15 0958    Education provided Yes   Education Details thoracic extension over horizontal pool noodle/ pec stretch on vertical noodle   Person(s) Educated Patient   Methods Explanation;Demonstration;Handout   Comprehension Verbalized understanding;Returned demonstration             PT Long Term Goals - 10/09/15 1506      PT LONG TERM GOAL #1   Title Independent with a HEP.   Time 4   Period Weeks   Status New     PT LONG TERM GOAL #2   Title Active left shoulder flexion to 150 degrees so the patient can easily reach overhead   Time 4   Period Weeks   Status New     PT LONG TERM GOAL #3   Title Active ER to 70 degrees+ to allow for easily donning/doffing of apparel   Time 4  Period Weeks   Status New     PT LONG TERM GOAL #4   Title Increase ROM so patient is able to reach behind back to L4 with left hand.   Time 4   Period Weeks   Status New     PT LONG TERM GOAL #5   Title Increase left shoulder strength to a solid 4+/5 to increase stability for performance of functional activities   Time 4   Period Weeks   Status New     PT LONG TERM GOAL #6   Title Perform ADL's with pain not > 3/10.   Time 4   Period Weeks   Status On-going               Plan - 10/14/15 1002    Clinical Impression Statement Patient continues to have marked ER restrictions. He has significant tightness in L subscapularis and pecs, however scapular motion is WNL. Patient returns to MD 10/16/15.   PT Treatment/Interventions ADLs/Self Care Home Management;Electrical Stimulation;Ultrasound;Moist Heat;Cryotherapy;Patient/family education;Therapeutic exercise;Therapeutic activities;Manual  techniques;Passive range of motion;Vasopneumatic Device   PT Next Visit Plan MD note for appt 10/16/15. Continue STW/MFR to pecs/subscap and ROM.   PT Home Exercise Plan HEP- ER , towel stretch; thoracic extension   Consulted and Agree with Plan of Care Patient      Patient will benefit from skilled therapeutic intervention in order to improve the following deficits and impairments:  Decreased range of motion, Decreased activity tolerance, Pain, Decreased strength  Visit Diagnosis: Stiffness of left shoulder, not elsewhere classified  Pain in left shoulder     Problem List There are no active problems to display for this patient.   Solon PalmJulie Moriah Loughry PT 10/14/2015, 3:59 PM  [facility] [address] [place], [place], [address] Phone: [phone]   Fax:  [phone]  Name: Jamie SnowballHoward Russell MRN: [id number] Date of Birth: [birth year]

## 2015-10-15 ENCOUNTER — Ambulatory Visit: Payer: BLUE CROSS/BLUE SHIELD | Admitting: Physical Therapy

## 2015-10-15 DIAGNOSIS — M25512 Pain in left shoulder: Secondary | ICD-10-CM | POA: Diagnosis not present

## 2015-10-15 DIAGNOSIS — M25612 Stiffness of left shoulder, not elsewhere classified: Secondary | ICD-10-CM

## 2015-10-15 NOTE — Therapy (Signed)
[facility] [address] [place], [place], [address] Phone: [phone]   Fax:  [phone]  Physical Therapy Treatment  Patient Details  Name: Jamie SnowballHoward Russell MRN: [id number] Date of Birth: 12/15/1960 Referring Provider: Ralene Batheracy Shuford PA-C.  Encounter Date: 10/15/2015      PT End of Session - 10/15/15 0824    Visit Number 20   Number of Visits 20   Date for PT Re-Evaluation 10/15/15   PT Start Time 0821   PT Stop Time 0912   PT Time Calculation (min) 51 min   Activity Tolerance Patient tolerated treatment well   Behavior [facility] Therapy [facility] for tasks assessed/performed      No past medical history on file.  No past surgical history on file.  There were no vitals filed for this visit.      Subjective Assessment - 10/15/15 0823    Subjective Reports no improvement since yesterday's treatment. Reports increased pain about 12 am last night and took pain medication.   Patient Stated Goals Use my left shoulder without pain.   Currently in Pain? Yes   Pain Score 1    Pain Location Shoulder   Pain Orientation Left   Pain Descriptors / Indicators Sore   Pain Onset In the past 7 days            [facility] - 10/15/15 0001      Assessment   Medical Diagnosis Left shoulder adhesive capsulitis.   Next MD Visit 10/16/2015     Precautions   Precautions None     ROM / Strength   AROM / PROM / Strength PROM     AROM   AROM Assessment Site Shoulder   Right/Left Shoulder Left   Left Shoulder Flexion 123 Degrees   Left Shoulder Internal Rotation 36 Degrees   Left Shoulder External Rotation 23 Degrees                     [facility] Adult PT Treatment/Exercise - 10/15/15 0001      Shoulder Exercises: Pulleys   Flexion Other (comment)  x8 min     Shoulder Exercises: Stretch   Corner Stretch 3 reps;30 seconds     Modalities   Modalities Electrical Stimulation;Moist Heat     Moist Heat Therapy   Number  Minutes Moist Heat 15 Minutes   Moist Heat Location Shoulder     Electrical Stimulation   Electrical Stimulation Location L shoulder   Electrical Stimulation Action IFC   Electrical Stimulation Parameters 1-10 hz x15 min   Electrical Stimulation Goals Pain     Manual Therapy   Manual Therapy Soft tissue mobilization;Myofascial release;Joint mobilization;Passive ROM   Joint Mobilization G II-III L glenohumeral joint mobilizations into A/P/I to promote increased ROM with scapular mobilizations to promote proper scapular mobility   Soft tissue mobilization STW to L deltoids, Pectoralis, Teres Minor to decrease tightness   Passive ROM LLDS into ER to decrease capsular tightness and improve ROM with compensatory strategies noted                PT Education - 10/14/15 0958    Education provided Yes   Education Details thoracic extension over horizontal pool noodle/ pec stretch on vertical noodle   Person(s) Educated Patient   Methods Explanation;Demonstration;Handout   Comprehension Verbalized understanding;Returned demonstration             PT Long Term Goals - 10/09/15 1506      PT LONG  TERM GOAL #1   Title Independent with a HEP.   Time 4   Period Weeks   Status New     PT LONG TERM GOAL #2   Title Active left shoulder flexion to 150 degrees so the patient can easily reach overhead   Time 4   Period Weeks   Status New     PT LONG TERM GOAL #3   Title Active ER to 70 degrees+ to allow for easily donning/doffing of apparel   Time 4   Period Weeks   Status New     PT LONG TERM GOAL #4   Title Increase ROM so patient is able to reach behind back to L4 with left hand.   Time 4   Period Weeks   Status New     PT LONG TERM GOAL #5   Title Increase left shoulder strength to a solid 4+/5 to increase stability for performance of functional activities   Time 4   Period Weeks   Status New     PT LONG TERM GOAL #6   Title Perform ADL's with pain not > 3/10.    Time 4   Period Weeks   Status On-going               Plan - 10/15/15 0901    Clinical Impression Statement Patient continues to present in clinic with L shoulder ROM limitations and muscle tightness even following manipulation 10/08/2015. Patient presented in clinic with tightness noted in L deltoids, Pectoralis, Teres Minor upon palpation. IASTW was completed to Pectoralis and Deltoids as well as Bicep to decrease tightness. Petiche response noted to iASTW treated muscles. Patient remained limited in regards to pulleys and doorway stretch due to ROM limitations. AROM L shoulder measured as 123 deg in supine, ER 23 deg, IR 36 deg. Normal modalities response noted following removal of the modalities. Good release of L Deltoids and Bicep region but tightness still noted in Pectoralis region. Patient encouraged to continue doorway stretch at home.   Rehab Potential Good   PT Frequency 3x / week   PT Duration 4 weeks   PT Treatment/Interventions ADLs/Self Care Home Management;Electrical Stimulation;Ultrasound;Moist Heat;Cryotherapy;Patient/family education;Therapeutic exercise;Therapeutic activities;Manual techniques;Passive range of motion;Vasopneumatic Device   PT Next Visit Plan Continue with ROM and manual therapy as needed with modalities per MD and MPT POC.   PT Home Exercise Plan HEP- ER , towel stretch; thoracic extension   Consulted and Agree with Plan of Care Patient      Patient will benefit from skilled therapeutic intervention in order to improve the following deficits and impairments:  Decreased range of motion, Decreased activity tolerance, Pain, Decreased strength  Visit Diagnosis: Pain in left shoulder  Stiffness of left shoulder, not elsewhere classified     Problem List There are no active problems to display for this patient.   Florence Canner, PTA 10/15/15 10:27 AM [place] MPT [facility] [address] [place], [place], [address] Phone: [phone]   Fax:  [phone]  Name: Jamie Russell MRN: [id number] Date of Birth: 01-Mar-1960

## 2015-10-17 ENCOUNTER — Ambulatory Visit: Payer: BLUE CROSS/BLUE SHIELD | Admitting: Physical Therapy

## 2015-10-17 DIAGNOSIS — M25612 Stiffness of left shoulder, not elsewhere classified: Secondary | ICD-10-CM | POA: Diagnosis not present

## 2015-10-17 DIAGNOSIS — M25512 Pain in left shoulder: Secondary | ICD-10-CM | POA: Diagnosis not present

## 2015-10-17 NOTE — Therapy (Signed)
[facility] [address] [place], [place], [address] Phone: [phone]   Fax:  [phone]  Physical Therapy Treatment  Patient Details  Name: Jamie SnowballHoward Wierman MRN: [id number] Date of Birth: 10/05/1960 Referring Provider: Ralene Batheracy Shuford PA-C.  Encounter Date: 10/17/2015      PT End of Session - 10/17/15 1508    PT Start Time 0100   PT Stop Time 0210   PT Time Calculation (min) 70 min   Activity Tolerance Patient tolerated treatment well   Behavior During Therapy [facility] for tasks assessed/performed      No past medical history on file.  No past surgical history on file.  There were no vitals filed for this visit.      Subjective Assessment - 10/17/15 1504    Subjective Dr.said to continue 3 times a week.   Patient Stated Goals Use my left shoulder without pain.   Pain Score 1    Pain Location Shoulder   Pain Orientation Left   Pain Descriptors / Indicators Sore   Pain Onset In the past 7 days      Treatment:  Pulleys x 5 minutes, UE Ranger into flexion and horiz abd x 5 minutes and wall ladder x 5 minutes.  In supine:  PROM including anterior cap stretching x 23 minutes to patient's left shoulder f/b medium vasopneumatic x 20 minutes.                                  PT Long Term Goals - 10/09/15 1506      PT LONG TERM GOAL #1   Title Independent with a HEP.   Time 4   Period Weeks   Status New     PT LONG TERM GOAL #2   Title Active left shoulder flexion to 150 degrees so the patient can easily reach overhead   Time 4   Period Weeks   Status New     PT LONG TERM GOAL #3   Title Active ER to 70 degrees+ to allow for easily donning/doffing of apparel   Time 4   Period Weeks   Status New     PT LONG TERM GOAL #4   Title Increase ROM so patient is able to reach behind back to L4 with left hand.   Time 4   Period Weeks   Status New     PT LONG TERM GOAL #5   Title Increase left shoulder  strength to a solid 4+/5 to increase stability for performance of functional activities   Time 4   Period Weeks   Status New     PT LONG TERM GOAL #6   Title Perform ADL's with pain not > 3/10.   Time 4   Period Weeks   Status On-going             Patient will benefit from skilled therapeutic intervention in order to improve the following deficits and impairments:     Visit Diagnosis: Pain in left shoulder - Plan: PT plan of care cert/re-cert  Stiffness of left shoulder, not elsewhere classified - Plan: PT plan of care cert/re-cert     Problem List There are no active problems to display for this patient.   Rontae Inglett, [place] 10/17/2015, 3:09 PM  [facility] [address] [place], [place], [address] Phone: [phone]   Fax:  [phone]  Name: Jamie SnowballHoward Hellstrom MRN: [id number] Date of  Birth: Apr 26, 1960

## 2015-10-18 ENCOUNTER — Ambulatory Visit: Payer: BLUE CROSS/BLUE SHIELD | Admitting: Physical Therapy

## 2015-10-18 DIAGNOSIS — M25612 Stiffness of left shoulder, not elsewhere classified: Secondary | ICD-10-CM | POA: Diagnosis not present

## 2015-10-18 DIAGNOSIS — M25512 Pain in left shoulder: Secondary | ICD-10-CM

## 2015-10-18 NOTE — Therapy (Signed)
[facility] [address] [place], [place], [address] Phone: [phone]   Fax:  [phone]  Physical Therapy Treatment  Patient Details  Name: Jamie Russell MRN: [id number] Date of Birth: 1960/12/18 Referring Provider: Ralene Bathe PA-C.  Encounter Date: 10/18/2015      PT End of Session - 10/18/15 1030    Visit Number 22   Number of Visits 32   Date for PT Re-Evaluation 11/26/15   PT Start Time 1033   PT Stop Time 1129   PT Time Calculation (min) 56 min   Activity Tolerance Patient tolerated treatment well   Behavior During Therapy [facility] for tasks assessed/performed      No past medical history on file.  No past surgical history on file.  There were no vitals filed for this visit.      Subjective Assessment - 10/18/15 1030    Subjective Reports no pain only soreness to palpation around L triceps.   Patient Stated Goals Use my left shoulder without pain.   Currently in Pain? Yes   Pain Score 1    Pain Location Shoulder   Pain Orientation Left;Posterior   Pain Descriptors / Indicators Sore   Pain Onset In the past 7 days            [facility] PT Assessment - 10/18/15 0001      Assessment   Medical Diagnosis Left shoulder adhesive capsulitis.   Next MD Visit 11/06/2015     Precautions   Precautions None                     [facility] Adult PT Treatment/Exercise - 10/18/15 0001      Shoulder Exercises: Standing   Other Standing Exercises LUE wall ladder x3 min into flexion     Shoulder Exercises: Pulleys   Flexion Other (comment)  x5 min   Other Pulley Exercises UE ranger into flex x30 reps, Pec stretch 5 sec hold      Shoulder Exercises: Stretch   Other Shoulder Stretches LUE towel stretch 3*30 sec     Modalities   Modalities Electrical Stimulation;Moist Heat     Moist Heat Therapy   Number Minutes Moist Heat 15 Minutes   Moist Heat Location Shoulder     Electrical Stimulation   Electrical  Stimulation Location L shoulder   Electrical Stimulation Action IFC   Electrical Stimulation Parameters 1-10 hz x15 min   Electrical Stimulation Goals Pain     Manual Therapy   Manual Therapy Joint mobilization;Passive ROM   Joint Mobilization G II-III L glenohumeral joint mobilizations into A/P/I to promote increased ROM   Passive ROM PROM of L shoulder with holds at end range into flex/ER/IR / horizontal adduction                     PT Long Term Goals - 10/09/15 1506      PT LONG TERM GOAL #1   Title Independent with a HEP.   Time 4   Period Weeks   Status New     PT LONG TERM GOAL #2   Title Active left shoulder flexion to 150 degrees so the patient can easily reach overhead   Time 4   Period Weeks   Status New     PT LONG TERM GOAL #3   Title Active ER to 70 degrees+ to allow for easily donning/doffing of apparel   Time 4   Period Weeks   Status New  PT LONG TERM GOAL #4   Title Increase ROM so patient is able to reach behind back to L4 with left hand.   Time 4   Period Weeks   Status New     PT LONG TERM GOAL #5   Title Increase left shoulder strength to a solid 4+/5 to increase stability for performance of functional activities   Time 4   Period Weeks   Status New     PT LONG TERM GOAL #6   Title Perform ADL's with pain not > 3/10.   Time 4   Period Weeks   Status On-going               Plan - 10/18/15 1117    Clinical Impression Statement Patient presented today with low level L tricep soreness but no L shoulder pain. Patient able to complete exercises as directed that was to decrease capsular tightness and increase ROM. Posterior capsule tightness was noted with L glenohumeral joint mobilizations into posterior direction. Tolerated glenohumeral joint mobilizations well without negetive complaint followed by PROM of L shoulder into flex/ER/IR and horizontal adduction for posterior capsule stretching. Normal modalities response noted  following removal of the modalities.   Rehab Potential Good   PT Frequency 3x / week   PT Duration 4 weeks   PT Treatment/Interventions ADLs/Self Care Home Management;Electrical Stimulation;Ultrasound;Moist Heat;Cryotherapy;Patient/family education;Therapeutic exercise;Therapeutic activities;Manual techniques;Passive range of motion;Vasopneumatic Device   PT Next Visit Plan Continue with ROM and manual therapy as needed with modalities per MD and MPT POC.   PT Home Exercise Plan HEP- ER , towel stretch; thoracic extension   Consulted and Agree with Plan of Care Patient      Patient will benefit from skilled therapeutic intervention in order to improve the following deficits and impairments:  Decreased range of motion, Decreased activity tolerance, Pain, Decreased strength  Visit Diagnosis: Pain in left shoulder  Stiffness of left shoulder, not elsewhere classified     Problem List There are no active problems to display for this patient.   Evelene CroonKelsey M Parsons, PTA 10/18/2015, 12:06 PM  [facility] [address] [place], [place], [address] Phone: [phone]   Fax:  [phone]  Name: Jamie SnowballHoward Russell MRN: [id number] Date of Birth: November 11, 1960

## 2015-10-21 ENCOUNTER — Ambulatory Visit: Payer: BLUE CROSS/BLUE SHIELD | Attending: Orthopedic Surgery | Admitting: Physical Therapy

## 2015-10-21 DIAGNOSIS — M25512 Pain in left shoulder: Secondary | ICD-10-CM | POA: Diagnosis not present

## 2015-10-21 DIAGNOSIS — M25612 Stiffness of left shoulder, not elsewhere classified: Secondary | ICD-10-CM

## 2015-10-21 NOTE — Therapy (Signed)
[facility] [address] [place], [place], [address] Phone: [phone]   Fax:  [phone]  Physical Therapy Treatment  Patient Details  Name: Jamie SnowballHoward Russell MRN: [id number] Date of Birth: [birth year] Referring Provider: Ralene Batheracy Shuford PA-C.  Encounter Date: 10/21/2015      PT End of Session - 10/21/15 1349    Visit Number 23   Number of Visits 32   Date for PT Re-Evaluation 11/26/15   PT Start Time 1345   PT Stop Time 1433   PT Time Calculation (min) 48 min   Activity Tolerance Patient tolerated treatment well   Behavior During Therapy [facility] for tasks assessed/performed      No past medical history on file.  No past surgical history on file.  There were no vitals filed for this visit.      Subjective Assessment - 10/21/15 1348    Subjective Reports only soreness in L shoulder "as usual" per patient. Reports that he had to get out and get up hay today.   Patient Stated Goals Use my left shoulder without pain.   Currently in Pain? Yes   Pain Score 2    Pain Location Shoulder   Pain Orientation Left   Pain Descriptors / Indicators Sore   Pain Onset In the past 7 days            [facility] - 10/21/15 0001      Assessment   Medical Diagnosis Left shoulder adhesive capsulitis.   Next MD Visit 11/06/2015     Precautions   Precautions None     AROM   Overall AROM  Deficits   AROM Assessment Site Shoulder   Right/Left Shoulder Left   Left Shoulder Flexion 128 Degrees                     [facility] Adult PT Treatment/Exercise - 10/21/15 0001      Shoulder Exercises: Pulleys   Flexion Other (comment)  x5 min   Other Pulley Exercises UE ranger into flex x6 min, Pec stretch 5 sec hold      Shoulder Exercises: Stretch   Internal Rotation Stretch 3 reps  x30 sec hold LUE   Other Shoulder Stretches LUE towel stretch 3*30 sec     Modalities   Modalities Electrical Stimulation;Moist Heat     Moist  Heat Therapy   Number Minutes Moist Heat 15 Minutes   Moist Heat Location Shoulder     Electrical Stimulation   Electrical Stimulation Location L shoulder   Electrical Stimulation Action IFC   Electrical Stimulation Parameters 1-10 hz x15 min   Electrical Stimulation Goals Pain     Manual Therapy   Manual Therapy Joint mobilization;Passive ROM   Joint Mobilization G II-III L glenohumeral joint mobilizations into A/P/I to promote increased ROM   Passive ROM PROM of L shoulder with holds at end range into flex/ER/IR / horizontal adduction                     PT Long Term Goals - 10/09/15 1506      PT LONG TERM GOAL #1   Title Independent with a HEP.   Time 4   Period Weeks   Status New     PT LONG TERM GOAL #2   Title Active left shoulder flexion to 150 degrees so the patient can easily reach overhead   Time 4   Period Weeks   Status New  PT LONG TERM GOAL #3   Title Active ER to 70 degrees+ to allow for easily donning/doffing of apparel   Time 4   Period Weeks   Status New     PT LONG TERM GOAL #4   Title Increase ROM so patient is able to reach behind back to L4 with left hand.   Time 4   Period Weeks   Status New     PT LONG TERM GOAL #5   Title Increase left shoulder strength to a solid 4+/5 to increase stability for performance of functional activities   Time 4   Period Weeks   Status New     PT LONG TERM GOAL #6   Title Perform ADL's with pain not > 3/10.   Time 4   Period Weeks   Status On-going               Plan - 10/21/15 1424    Clinical Impression Statement Patient continues to tolerate treatment fairly well as activty increases, soreness of L shoulder increases. Greatest soreness encountered during L shoulder towel stretch per patient report. Patient educated regarding L shoulder sleeper stretch in L sidelying to decrease L posterior capsule tightness. Posterior and inferior capsule tightness noted with L glenohumeral joint  mobilizations grade II-III followed by PROM of L shoulder into flexion/ER/IR. Patient experienced sorenes with L shoulder during PROM in superior L shoulder per patient report. Normal modalities response noted following removal of the modalities.   Rehab Potential Good   PT Frequency 3x / week   PT Duration 4 weeks   PT Treatment/Interventions ADLs/Self Care Home Management;Electrical Stimulation;Ultrasound;Moist Heat;Cryotherapy;Patient/family education;Therapeutic exercise;Therapeutic activities;Manual techniques;Passive range of motion;Vasopneumatic Device   PT Next Visit Plan Continue with ROM and manual therapy as needed with modalities per MD and MPT POC.   PT Home Exercise Plan HEP- ER , towel stretch; thoracic extension   Consulted and Agree with Plan of Care Patient      Patient will benefit from skilled therapeutic intervention in order to improve the following deficits and impairments:  Decreased range of motion, Decreased activity tolerance, Pain, Decreased strength  Visit Diagnosis: Stiffness of left shoulder, not elsewhere classified  Acute pain of left shoulder     Problem List There are no active problems to display for this patient.   Evelene Croon, PTA 10/21/2015, 2:47 PM  [facility] [address] [place], [place], [address] Phone: [phone]   Fax:  [phone]  Name: Jamie Russell MRN: [id number] Date of Birth: 1960-12-03

## 2015-10-23 ENCOUNTER — Ambulatory Visit: Payer: BLUE CROSS/BLUE SHIELD | Admitting: Physical Therapy

## 2015-10-23 ENCOUNTER — Encounter: Payer: Self-pay | Admitting: Physical Therapy

## 2015-10-23 DIAGNOSIS — M25612 Stiffness of left shoulder, not elsewhere classified: Secondary | ICD-10-CM

## 2015-10-23 DIAGNOSIS — Z23 Encounter for immunization: Secondary | ICD-10-CM | POA: Diagnosis not present

## 2015-10-23 DIAGNOSIS — M25512 Pain in left shoulder: Secondary | ICD-10-CM

## 2015-10-23 NOTE — Therapy (Signed)
[facility] [address] [place], [place], [address] Phone: [phone]   Fax:  [phone]  Physical Therapy Treatment  Patient Details  Name: Jamie SnowballHoward Crader MRN: [id number] Date of Birth: 19-Dec-1960 Referring Provider: Ralene Batheracy Shuford PA-C.  Encounter Date: 10/23/2015      PT End of Session - 10/23/15 1309    Visit Number 24   Number of Visits 32   Date for PT Re-Evaluation 11/26/15   PT Start Time 1230   PT Stop Time 1315   PT Time Calculation (min) 45 min   Activity Tolerance Patient tolerated treatment well   Behavior During Therapy [facility] for tasks assessed/performed      History reviewed. No pertinent past medical history.  History reviewed. No pertinent surgical history.  There were no vitals filed for this visit.      Subjective Assessment - 10/23/15 1245    Subjective Patient reported ongoing soreness and stiffness yet progressing well   Patient Stated Goals Use my left shoulder without pain.   Currently in Pain? Yes   Pain Score 3    Pain Location Shoulder   Pain Orientation Left   Pain Descriptors / Indicators Sore   Pain Onset More than a month ago   Pain Frequency Constant   Aggravating Factors  ROM   Pain Relieving Factors at rest            [facility] PT Assessment - 10/23/15 0001      AROM   AROM Assessment Site Shoulder   Right/Left Shoulder Left   Left Shoulder Flexion 135 Degrees   Left Shoulder External Rotation 30 Degrees     PROM   PROM Assessment Site Shoulder   Right/Left Shoulder Left   Left Shoulder Flexion 145 Degrees   Left Shoulder External Rotation 44 Degrees                     [facility] Adult PT Treatment/Exercise - 10/23/15 0001      Shoulder Exercises: Pulleys   Flexion --  5min   Other Pulley Exercises UE ranger for elevation and ER x766min     Moist Heat Therapy   Number Minutes Moist Heat 15 Minutes   Moist Heat Location Shoulder     Electrical Stimulation   Electrical Stimulation Location L shoulder   Electrical Stimulation Action IFC   Electrical Stimulation Parameters 1-10hz  x7315min   Electrical Stimulation Goals Pain     Manual Therapy   Manual Therapy Joint mobilization;Passive ROM   Joint Mobilization G II-III L glenohumeral joint mobilizations into A/P/I to promote increased ROM   Passive ROM PROM of L shoulder with holds at end range into flex/ER/IR / horizontal adduction                     PT Long Term Goals - 10/23/15 1310      PT LONG TERM GOAL #1   Title Independent with a HEP.   Time 4   Period Weeks   Status On-going     PT LONG TERM GOAL #2   Title Active left shoulder flexion to 150 degrees so the patient can easily reach overhead   Time 4   Period Weeks   Status On-going  AROM 135 degrees 10/23/15     PT LONG TERM GOAL #3   Title Active ER to 70 degrees+ to allow for easily donning/doffing of apparel   Time 4   Period Weeks   Status On-going  AROM 30 degrees 10/23/15     PT LONG TERM GOAL #4   Title Increase ROM so patient is able to reach behind back to L4 with left hand.   Time 4   Period Weeks   Status On-going     PT LONG TERM GOAL #5   Title Increase left shoulder strength to a solid 4+/5 to increase stability for performance of functional activities   Time 4   Period Weeks   Status On-going     PT LONG TERM GOAL #6   Title Perform ADL's with pain not > 3/10.   Time 4   Period Weeks   Status On-going               Plan - 10/23/15 1314    Clinical Impression Statement Patient progressing with ROM in left shoulder and tolerated treatment fairly well with some increased pain with manual stretching. Patint has some guarding in shoulder with ROM and required ossilations to relax muscles. Patient improved with ROM for left shoulder flexion and ER today. Goals ongoing at this time due to ROM deficits.   Rehab Potential Good   PT Frequency 3x / week   PT Duration 4 weeks   PT  Treatment/Interventions ADLs/Self Care Home Management;Electrical Stimulation;Ultrasound;Moist Heat;Cryotherapy;Patient/family education;Therapeutic exercise;Therapeutic activities;Manual techniques;Passive range of motion;Vasopneumatic Device   PT Next Visit Plan Continue with ROM and manual therapy as needed with modalities per MD and MPT POC.   Consulted and Agree with Plan of Care Patient      Patient will benefit from skilled therapeutic intervention in order to improve the following deficits and impairments:  Decreased range of motion, Decreased activity tolerance, Pain, Decreased strength  Visit Diagnosis: Stiffness of left shoulder, not elsewhere classified  Acute pain of left shoulder  Left shoulder pain, unspecified chronicity     Problem List There are no active problems to display for this patient.   Hermelinda Dellen, PTA 10/23/2015, 1:17 PM  [facility] [address] [place], [place], [address] Phone: [phone]   Fax:  [phone]  Name: Merril Nagy MRN: [id number] Date of Birth: 09-23-1960

## 2015-10-25 ENCOUNTER — Ambulatory Visit: Payer: BLUE CROSS/BLUE SHIELD | Admitting: Physical Therapy

## 2015-10-25 DIAGNOSIS — M25612 Stiffness of left shoulder, not elsewhere classified: Secondary | ICD-10-CM

## 2015-10-25 DIAGNOSIS — M25512 Pain in left shoulder: Secondary | ICD-10-CM

## 2015-10-25 NOTE — Therapy (Signed)
[facility] [address] [place], [place], [address] Phone: [phone]   Fax:  [phone]  Physical Therapy Treatment  Patient Details  Name: Jamie SnowballHoward Russell MRN: [id number] Date of Birth: May 01, 1960 Referring Provider: Ralene Batheracy Shuford PA-C.  Encounter Date: 10/25/2015      PT End of Session - 10/25/15 1119    Visit Number 25   Number of Visits 32   Date for PT Re-Evaluation 11/26/15   PT Start Time 1117   PT Stop Time 1218   PT Time Calculation (min) 61 min   Activity Tolerance Patient tolerated treatment well   Behavior During Therapy [facility] for tasks assessed/performed      No past medical history on file.  No past surgical history on file.  There were no vitals filed for this visit.      Subjective Assessment - 10/25/15 1118    Subjective Reports that he had L shoulder pain (8/10 soreness and burning) all day yesterday but this morning hasn't been too bad. Can now meet L hand with R hand to donn a belt.   Patient Stated Goals Use my left shoulder without pain.   Currently in Pain? Yes   Pain Score 1    Pain Location Shoulder   Pain Orientation Left   Pain Descriptors / Indicators Sore   Pain Onset More than a month ago            [facility] - 10/25/15 0001      Assessment   Medical Diagnosis Left shoulder adhesive capsulitis.   Next MD Visit 11/06/2015     Precautions   Precautions None                     [facility] Adult PT Treatment/Exercise - 10/25/15 0001      Shoulder Exercises: Standing   Other Standing Exercises LUE wall ladder x3 min into flexion     Shoulder Exercises: Pulleys   Flexion Other (comment)  x6 min   Other Pulley Exercises UE ranger for elevation and ER to prmote increased ROM     Shoulder Exercises: Stretch   Cross Chest Stretch 3 reps;30 seconds  LUE   Internal Rotation Stretch 5 reps  x30 sec hold   Other Shoulder Stretches LUE towel stretch 5*30 sec     Modalities   Modalities Electrical Stimulation;Moist Heat     Moist Heat Therapy   Number Minutes Moist Heat 15 Minutes   Moist Heat Location Shoulder     Electrical Stimulation   Electrical Stimulation Location L shoulder   Electrical Stimulation Action IFC   Electrical Stimulation Parameters 1-10 hz x15 min   Electrical Stimulation Goals Pain     Manual Therapy   Manual Therapy Joint mobilization;Passive ROM   Joint Mobilization G II-III L glenohumeral joint mobilizations into A/P/I to promote increased ROM   Passive ROM PROM of L shoulder with holds at end range into flex/ER/IR                     PT Long Term Goals - 10/25/15 1120      PT LONG TERM GOAL #1   Title Independent with a HEP.   Time 4   Period Weeks   Status Achieved     PT LONG TERM GOAL #2   Title Active left shoulder flexion to 150 degrees so the patient can easily reach overhead   Time 4   Period Weeks   Status  On-going  AROM 135 degrees 10/23/15     PT LONG TERM GOAL #3   Title Active ER to 70 degrees+ to allow for easily donning/doffing of apparel   Time 4   Period Weeks   Status On-going  AROM 30 degrees 10/23/15     PT LONG TERM GOAL #4   Title Increase ROM so patient is able to reach behind back to L4 with left hand.   Time 4   Period Weeks   Status On-going     PT LONG TERM GOAL #5   Title Increase left shoulder strength to a solid 4+/5 to increase stability for performance of functional activities   Time 4   Period Weeks   Status On-going     PT LONG TERM GOAL #6   Title Perform ADL's with pain not > 3/10.   Time 4   Period Weeks   Status On-going               Plan - 10/25/15 1231    Clinical Impression Statement Patient continues to present with R shoulder limitations with ROM and capsule tightness. Patient now able to reach behind his back to meet his R hand to don a belt. Capsule tightness continues to be noted with anterior and posterior directions.  Normal modalities response noted following removal of the modalities.   Rehab Potential Good   PT Frequency 3x / week   PT Duration 4 weeks   PT Treatment/Interventions ADLs/Self Care Home Management;Electrical Stimulation;Ultrasound;Moist Heat;Cryotherapy;Patient/family education;Therapeutic exercise;Therapeutic activities;Manual techniques;Passive range of motion;Vasopneumatic Device   PT Next Visit Plan Continue with ROM and manual therapy as needed with modalities per MD and MPT POC.   PT Home Exercise Plan HEP- ER , towel stretch; thoracic extension      Patient will benefit from skilled therapeutic intervention in order to improve the following deficits and impairments:  Decreased range of motion, Decreased activity tolerance, Pain, Decreased strength  Visit Diagnosis: Stiffness of left shoulder, not elsewhere classified  Acute pain of left shoulder     Problem List There are no active problems to display for this patient.   Evelene Croon, PTA 10/25/2015, 12:37 PM  [facility] [address] [place], [place], [address] Phone: [phone]   Fax:  [phone]  Name: Jamie Russell MRN: [id number] Date of Birth: 1960/04/16

## 2015-10-28 ENCOUNTER — Encounter: Payer: Self-pay | Admitting: Physical Therapy

## 2015-10-28 ENCOUNTER — Ambulatory Visit: Payer: BLUE CROSS/BLUE SHIELD | Admitting: Physical Therapy

## 2015-10-28 DIAGNOSIS — M25612 Stiffness of left shoulder, not elsewhere classified: Secondary | ICD-10-CM

## 2015-10-28 DIAGNOSIS — M25512 Pain in left shoulder: Secondary | ICD-10-CM | POA: Diagnosis not present

## 2015-10-28 NOTE — Therapy (Signed)
[facility] [address] [place], [place], [address] Phone: [phone]   Fax:  [phone]  Physical Therapy Treatment  Patient Details  Name: Jamie SnowballHoward Russell MRN: [id number] Date of Birth: Apr 28, 1960 Referring Provider: Ralene Batheracy Shuford PA-C.  Encounter Date: 10/28/2015      PT End of Session - 10/28/15 0858    Visit Number 26   Date for PT Re-Evaluation 11/26/15   PT Start Time 0815   PT Stop Time 0913   PT Time Calculation (min) 58 min   Activity Tolerance Patient tolerated treatment well   Behavior During Therapy [facility] for tasks assessed/performed      History reviewed. No pertinent past medical history.  History reviewed. No pertinent surgical history.  There were no vitals filed for this visit.      Subjective Assessment - 10/28/15 0828    Subjective Patient reported ongoing soreness and stiffness in shoulder   Patient Stated Goals Use my left shoulder without pain.   Currently in Pain? Yes   Pain Score 4    Pain Location Shoulder   Pain Orientation Left   Pain Descriptors / Indicators Sore   Pain Onset More than a month ago   Pain Frequency Constant   Aggravating Factors  ROM    Pain Relieving Factors at rest            [facility] Assessment - 10/28/15 0001      AROM   AROM Assessment Site Shoulder   Right/Left Shoulder Left   Left Shoulder Flexion 126 Degrees   Left Shoulder External Rotation 28 Degrees     PROM   PROM Assessment Site Shoulder   Right/Left Shoulder Left   Left Shoulder Flexion 146 Degrees   Left Shoulder External Rotation 46 Degrees                     [facility] Adult PT Treatment/Exercise - 10/28/15 0001      Shoulder Exercises: Pulleys   Flexion Other (comment)  4min   Other Pulley Exercises UE ranger for elevation and ER to prmote increased ROM     Moist Heat Therapy   Number Minutes Moist Heat 15 Minutes   Moist Heat Location Shoulder     Electrical Stimulation   Electrical Stimulation Location L shoulder   Electrical Stimulation Action IFC   Electrical Stimulation Parameters 1-10hz  x3115min   Electrical Stimulation Goals Pain     Manual Therapy   Manual Therapy Joint mobilization;Passive ROM   Joint Mobilization G II-III L glenohumeral joint mobilizations into A/P/I to promote increased ROM   Passive ROM PROM of L shoulder with holds at end range into flex/ER/IR                     PT Long Term Goals - 10/25/15 1120      PT LONG TERM GOAL #1   Title Independent with a HEP.   Time 4   Period Weeks   Status Achieved     PT LONG TERM GOAL #2   Title Active left shoulder flexion to 150 degrees so the patient can easily reach overhead   Time 4   Period Weeks   Status On-going  AROM 135 degrees 10/23/15     PT LONG TERM GOAL #3   Title Active ER to 70 degrees+ to allow for easily donning/doffing of apparel   Time 4   Period Weeks   Status On-going  AROM 30 degrees 10/23/15  PT LONG TERM GOAL #4   Title Increase ROM so patient is able to reach behind back to L4 with left hand.   Time 4   Period Weeks   Status On-going     PT LONG TERM GOAL #5   Title Increase left shoulder strength to a solid 4+/5 to increase stability for performance of functional activities   Time 4   Period Weeks   Status On-going     PT LONG TERM GOAL #6   Title Perform ADL's with pain not > 3/10.   Time 4   Period Weeks   Status On-going               Plan - 10/28/15 0859    Clinical Impression Statement Patient progressing slowly due to tightness in left shoulder and ongoing soreness. Patient tolerated treatment well with some increased soreness with ROM. Patient continues to have guarding with manual stretching and requires oscilcations to relax. Patient goals ongoing due to ROM and pain deficts.   Rehab Potential Good   PT Frequency 3x / week   PT Duration 4 weeks   PT Treatment/Interventions ADLs/Self Care Home  Management;Electrical Stimulation;Ultrasound;Moist Heat;Cryotherapy;Patient/family education;Therapeutic exercise;Therapeutic activities;Manual techniques;Passive range of motion;Vasopneumatic Device   PT Next Visit Plan Continue with ROM and manual therapy as needed with modalities per MD and MPT POC. (MD. Rennis Chris 11/06/15)   Consulted and Agree with Plan of Care Patient      Patient will benefit from skilled therapeutic intervention in order to improve the following deficits and impairments:  Decreased range of motion, Decreased activity tolerance, Pain, Decreased strength  Visit Diagnosis: Stiffness of left shoulder, not elsewhere classified  Acute pain of left shoulder  Left shoulder pain, unspecified chronicity     Problem List There are no active problems to display for this patient.   Hermelinda Dellen, PTA 10/28/2015, 9:50 AM  [facility] [address] [place], [place], [address] Phone: [phone]   Fax:  [phone]  Name: Jamie Russell MRN: [id number] Date of Birth: [birth year]

## 2015-10-30 ENCOUNTER — Encounter: Payer: Self-pay | Admitting: Physical Therapy

## 2015-10-30 ENCOUNTER — Ambulatory Visit: Payer: BLUE CROSS/BLUE SHIELD | Admitting: Physical Therapy

## 2015-10-30 DIAGNOSIS — M25612 Stiffness of left shoulder, not elsewhere classified: Secondary | ICD-10-CM | POA: Diagnosis not present

## 2015-10-30 DIAGNOSIS — M25512 Pain in left shoulder: Secondary | ICD-10-CM

## 2015-10-30 NOTE — Therapy (Signed)
[facility] [address] [place], [place], [address] Phone: [phone]   Fax:  [phone]  Physical Therapy Treatment  Patient Details  Name: Jamie SnowballHoward Russell MRN: [id number] Date of Birth: 10/23/1960 Referring Provider: Ralene Batheracy Shuford PA-C.  Encounter Date: 10/30/2015      PT End of Session - 10/30/15 0856    Visit Number 27   Number of Visits 32   Date for PT Re-Evaluation 11/26/15   PT Start Time 0814   PT Stop Time 0912   PT Time Calculation (min) 58 min   Activity Tolerance Patient tolerated treatment well   Behavior During Therapy [facility] for tasks assessed/performed      History reviewed. No pertinent past medical history.  History reviewed. No pertinent surgical history.  There were no vitals filed for this visit.      Subjective Assessment - 10/30/15 0825    Subjective Patient reported some soreness after last treatment then "felt some better yesterday"   Patient Stated Goals Use my left shoulder without pain.   Currently in Pain? Yes   Pain Score 4    Pain Location Shoulder   Pain Orientation Left   Pain Descriptors / Indicators Sore   Pain Onset More than a month ago   Pain Frequency Constant   Aggravating Factors  ROM   Pain Relieving Factors at rest            [facility] Assessment - 10/30/15 0001      AROM   AROM Assessment Site Shoulder   Right/Left Shoulder Left   Left Shoulder External Rotation 34 Degrees     PROM   PROM Assessment Site Shoulder   Right/Left Shoulder Left   Left Shoulder Flexion 149 Degrees   Left Shoulder External Rotation 50 Degrees                     [facility] Adult PT Treatment/Exercise - 10/30/15 0001      Shoulder Exercises: Pulleys   Flexion Other (comment)  5 min   Other Pulley Exercises UE ranger for elevation and ER to prmote increased ROM  5min     Moist Heat Therapy   Number Minutes Moist Heat 15 Minutes   Moist Heat Location Shoulder     Electrical Stimulation   Electrical Stimulation Location L shoulder   Electrical Stimulation Action IFC   Electrical Stimulation Parameters 1-10hz  x6015min   Electrical Stimulation Goals Pain     Manual Therapy   Manual Therapy Joint mobilization;Passive ROM   Joint Mobilization G II-III L glenohumeral joint mobilizations into A/P/I to promote increased ROM   Passive ROM PROM of L shoulder with holds at end range into flex/ER/IR                     PT Long Term Goals - 10/25/15 1120      PT LONG TERM GOAL #1   Title Independent with a HEP.   Time 4   Period Weeks   Status Achieved     PT LONG TERM GOAL #2   Title Active left shoulder flexion to 150 degrees so the patient can easily reach overhead   Time 4   Period Weeks   Status On-going  AROM 135 degrees 10/23/15     PT LONG TERM GOAL #3   Title Active ER to 70 degrees+ to allow for easily donning/doffing of apparel   Time 4   Period Weeks   Status On-going  AROM 30 degrees 10/23/15     PT LONG TERM GOAL #4   Title Increase ROM so patient is able to reach behind back to L4 with left hand.   Time 4   Period Weeks   Status On-going     PT LONG TERM GOAL #5   Title Increase left shoulder strength to a solid 4+/5 to increase stability for performance of functional activities   Time 4   Period Weeks   Status On-going     PT LONG TERM GOAL #6   Title Perform ADL's with pain not > 3/10.   Time 4   Period Weeks   Status On-going               Plan - 10/30/15 0857    Clinical Impression Statement Patient progressing with ROM today and was able to improve ROM for both left shoulder flexion and ER. Patient has ongoing soreness and tightness in shoulser with any ROM/stretching. Patient current goals ongoing due to ROM and pain deficts.   Rehab Potential Good   PT Frequency 3x / week   PT Duration 4 weeks   PT Treatment/Interventions ADLs/Self Care Home Management;Electrical Stimulation;Ultrasound;Moist  Heat;Cryotherapy;Patient/family education;Therapeutic exercise;Therapeutic activities;Manual techniques;Passive range of motion;Vasopneumatic Device   PT Next Visit Plan Continue with ROM and manual therapy as needed with modalities per MD and MPT POC. (MD. Rennis Chris 11/06/15) Patient ins limit to 30 visits and awaiting approval for extended visits   Consulted and Agree with Plan of Care Patient      Patient will benefit from skilled therapeutic intervention in order to improve the following deficits and impairments:  Decreased range of motion, Decreased activity tolerance, Pain, Decreased strength  Visit Diagnosis: Stiffness of left shoulder, not elsewhere classified  Acute pain of left shoulder     Problem List There are no active problems to display for this patient.   Hermelinda Dellen, PTA 10/30/2015, 9:12 AM  [facility] [address] [place], [place], [address] Phone: [phone]   Fax:  [phone]  Name: Jamie Russell MRN: [id number] Date of Birth: [birth year]

## 2015-11-01 ENCOUNTER — Ambulatory Visit: Payer: BLUE CROSS/BLUE SHIELD | Admitting: *Deleted

## 2015-11-01 DIAGNOSIS — M25612 Stiffness of left shoulder, not elsewhere classified: Secondary | ICD-10-CM

## 2015-11-01 DIAGNOSIS — M25512 Pain in left shoulder: Secondary | ICD-10-CM

## 2015-11-01 NOTE — Therapy (Signed)
[facility] [address] [place], [place], [address] Phone: [phone]   Fax:  [phone]  Physical Therapy Treatment  Patient Details  Name: Jamie Russell MRN: [id number] Date of Birth: [birth year] Referring Provider: Ralene Bathe PA-C.  Encounter Date: 11/01/2015      PT End of Session - 11/01/15 0827    Visit Number 28   Number of Visits 30   Date for PT Re-Evaluation 11/26/15   PT Start Time 0815   PT Stop Time 0914   PT Time Calculation (min) 59 min      No past medical history on file.  No past surgical history on file.  There were no vitals filed for this visit.      Subjective Assessment - 11/01/15 0826    Subjective Patient reported some soreness after last treatment then "felt some better yesterday"  To MD on WED.   Patient Stated Goals Use my left shoulder without pain.   Currently in Pain? Yes   Pain Score 1    Pain Location Shoulder   Pain Orientation Left   Pain Descriptors / Indicators Sore   Pain Onset More than a month ago   Pain Frequency Intermittent            [facility] PT Assessment - 11/01/15 0001      AROM   AROM Assessment Site Shoulder   Right/Left Shoulder Left     PROM   PROM Assessment Site Shoulder   Right/Left Shoulder Left   Left Shoulder Flexion 152 Degrees   Left Shoulder Internal Rotation 78 Degrees   Left Shoulder External Rotation 50 Degrees                     [facility] Adult PT Treatment/Exercise - 11/01/15 0001      Shoulder Exercises: Pulleys   Flexion Other (comment)  5 min     Shoulder Exercises: ROM/Strengthening   UBE (Upper Arm Bike) 90 RPM RPM x 8 min     Electrical Stimulation   Electrical Stimulation Location L shoulder     Manual Therapy   Manual Therapy Joint mobilization;Passive ROM   Joint Mobilization G II-III L glenohumeral joint mobilizations into A/P/I to promote increased ROM   Passive ROM PROM of L shoulder with holds at end range  into flex/ER/IR                     PT Long Term Goals - 10/25/15 1120      PT LONG TERM GOAL #1   Title Independent with a HEP.   Time 4   Period Weeks   Status Achieved     PT LONG TERM GOAL #2   Title Active left shoulder flexion to 150 degrees so the patient can easily reach overhead   Time 4   Period Weeks   Status On-going  AROM 135 degrees 10/23/15     PT LONG TERM GOAL #3   Title Active ER to 70 degrees+ to allow for easily donning/doffing of apparel   Time 4   Period Weeks   Status On-going  AROM 30 degrees 10/23/15     PT LONG TERM GOAL #4   Title Increase ROM so patient is able to reach behind back to L4 with left hand.   Time 4   Period Weeks   Status On-going     PT LONG TERM GOAL #5   Title Increase left shoulder strength to a solid 4+/5  to increase stability for performance of functional activities   Time 4   Period Weeks   Status On-going     PT LONG TERM GOAL #6   Title Perform ADL's with pain not > 3/10.   Time 4   Period Weeks   Status On-going               Plan - 11/01/15 0829    Clinical Impression Statement Pt. did fairly well with Rx today and was able to reach 152 degrees flexion today, but er is still limited to around 50 degrees. 2 visits left next week   Rehab Potential Good   PT Frequency 3x / week   PT Duration 4 weeks   PT Treatment/Interventions ADLs/Self Care Home Management;Electrical Stimulation;Ultrasound;Moist Heat;Cryotherapy;Patient/family education;Therapeutic exercise;Therapeutic activities;Manual techniques;Passive range of motion;Vasopneumatic Device   PT Next Visit Plan Continue with ROM and manual therapy as needed with modalities per MD and MPT POC. (MD. Rennis ChrisSupple 11/06/15) Patient ins limit to 30 visits and awaiting approval for extended visits.   MD note next Tuesday   PT Home Exercise Plan HEP- ER , towel stretch; thoracic extension   Consulted and Agree with Plan of Care Patient      Patient  will benefit from skilled therapeutic intervention in order to improve the following deficits and impairments:  Decreased range of motion, Decreased activity tolerance, Pain, Decreased strength  Visit Diagnosis: Stiffness of left shoulder, not elsewhere classified  Acute pain of left shoulder  Left shoulder pain, unspecified chronicity     Problem List There are no active problems to display for this patient.   Elara Cocke,CHRIS, PTA 11/01/2015, 10:00 AM  [facility] [address] [place], [place], [address] Phone: [phone]   Fax:  [phone]  Name: Lamont SnowballHoward Kassem MRN: [id number] Date of Birth: 1960-10-05

## 2015-11-04 ENCOUNTER — Encounter: Payer: Self-pay | Admitting: Physical Therapy

## 2015-11-04 ENCOUNTER — Ambulatory Visit: Payer: BLUE CROSS/BLUE SHIELD | Admitting: Physical Therapy

## 2015-11-04 DIAGNOSIS — M25512 Pain in left shoulder: Secondary | ICD-10-CM

## 2015-11-04 DIAGNOSIS — M25612 Stiffness of left shoulder, not elsewhere classified: Secondary | ICD-10-CM | POA: Diagnosis not present

## 2015-11-04 NOTE — Therapy (Signed)
[facility] [address] [place], [place], [address] Phone: [phone]   Fax:  [phone]  Physical Therapy Treatment  Patient Details  Name: Jamie SnowballHoward Russell MRN: [id number] Date of Birth: [birth year] Referring Provider: Ralene Batheracy Shuford PA-C.  Encounter Date: 11/04/2015      PT End of Session - 11/04/15 0933    Visit Number 29   Number of Visits 30   Date for PT Re-Evaluation 11/26/15   PT Start Time 0900   PT Stop Time 0958   PT Time Calculation (min) 58 min   Activity Tolerance Patient tolerated treatment well   Behavior During Therapy [facility] for tasks assessed/performed      History reviewed. No pertinent past medical history.  History reviewed. No pertinent surgical history.  There were no vitals filed for this visit.      Subjective Assessment - 11/04/15 0907    Subjective Patient reported doing better over weekend and able to wax car   Patient Stated Goals Use my left shoulder without pain.   Currently in Pain? Yes   Pain Score 1    Pain Location Shoulder   Pain Orientation Left   Pain Descriptors / Indicators Sore   Pain Onset More than a month ago   Pain Frequency Intermittent   Aggravating Factors  ROM   Pain Relieving Factors at rest            [facility] PT Assessment - 11/04/15 0001      AROM   AROM Assessment Site Shoulder   Right/Left Shoulder Left   Left Shoulder External Rotation 40 Degrees  scaption     PROM   PROM Assessment Site Shoulder   Right/Left Shoulder Left   Left Shoulder Flexion 151 Degrees   Left Shoulder External Rotation 58 Degrees  scaption                     [facility] Adult PT Treatment/Exercise - 11/04/15 0001      Shoulder Exercises: Pulleys   Flexion Other (comment)  5min   Other Pulley Exercises UE ranger for elevation and ER to prmote increased ROM     Moist Heat Therapy   Number Minutes Moist Heat 15 Minutes   Moist Heat Location Shoulder     Electrical  Stimulation   Electrical Stimulation Location L shoulder   Electrical Stimulation Action IFC   Electrical Stimulation Parameters 1-10hz    Electrical Stimulation Goals Pain     Manual Therapy   Manual Therapy Joint mobilization;Passive ROM   Joint Mobilization G II-III L glenohumeral joint mobilizations into A/P/I to promote increased ROM   Passive ROM PROM of L shoulder with holds at end range into flex/ER/IR                     PT Long Term Goals - 10/25/15 1120      PT LONG TERM GOAL #1   Title Independent with a HEP.   Time 4   Period Weeks   Status Achieved     PT LONG TERM GOAL #2   Title Active left shoulder flexion to 150 degrees so the patient can easily reach overhead   Time 4   Period Weeks   Status On-going  AROM 135 degrees 10/23/15     PT LONG TERM GOAL #3   Title Active ER to 70 degrees+ to allow for easily donning/doffing of apparel   Time 4   Period Weeks   Status On-going  AROM 30 degrees 10/23/15     PT LONG TERM GOAL #4   Title Increase ROM so patient is able to reach behind back to L4 with left hand.   Time 4   Period Weeks   Status On-going     PT LONG TERM GOAL #5   Title Increase left shoulder strength to a solid 4+/5 to increase stability for performance of functional activities   Time 4   Period Weeks   Status On-going     PT LONG TERM GOAL #6   Title Perform ADL's with pain not > 3/10.   Time 4   Period Weeks   Status On-going               Plan - 11/04/15 0944    Clinical Impression Statement Patient progressing with ROM due to ongoing tightness in left shoulder. Patient is limited with ROM yet is able to increase range passively after aggresive stretching. Patient continues to perform self stretches daily. Patient goals ongoing due to full ROM limitations.   Rehab Potential Good   PT Frequency 3x / week   PT Duration 4 weeks   PT Treatment/Interventions ADLs/Self Care Home Management;Electrical  Stimulation;Ultrasound;Moist Heat;Cryotherapy;Patient/family education;Therapeutic exercise;Therapeutic activities;Manual techniques;Passive range of motion;Vasopneumatic Device   PT Next Visit Plan Patient has one visit due to insurance limit. DC to [employer] and Agree with Plan of Care Patient      Patient will benefit from skilled therapeutic intervention in order to improve the following deficits and impairments:  Decreased range of motion, Decreased activity tolerance, Pain, Decreased strength  Visit Diagnosis: Stiffness of left shoulder, not elsewhere classified  Acute pain of left shoulder     Problem List There are no active problems to display for this patient.   Hermelinda Dellen, PTA 11/04/2015, 9:58 AM  [facility] [address] [place], [place], [address] Phone: [phone]   Fax:  [phone]  Name: Jamie Russell MRN: [id number] Date of Birth: Oct 29, 1960

## 2015-11-05 ENCOUNTER — Ambulatory Visit: Payer: BLUE CROSS/BLUE SHIELD | Admitting: Physical Therapy

## 2015-11-05 DIAGNOSIS — M25612 Stiffness of left shoulder, not elsewhere classified: Secondary | ICD-10-CM | POA: Diagnosis not present

## 2015-11-05 DIAGNOSIS — M25512 Pain in left shoulder: Secondary | ICD-10-CM | POA: Diagnosis not present

## 2015-11-05 NOTE — Therapy (Signed)
[facility] [place], [place], [address] Phone: [phone]   Fax:  [phone]  Physical Therapy Treatment  Patient Details  Name: Jamie Russell MRN: [id number] Date of Birth: 1960-03-02 Referring Provider: Jenetta Loges PA-C.  Encounter Date: 11/05/2015    No past medical history on file.  No past surgical history on file.  There were no vitals filed for this visit.                                    PT Long Term Goals - 11/05/15 0951      PT LONG TERM GOAL #1   Title Independent with a HEP.   Time 4   Period Weeks   Status Achieved     PT LONG TERM GOAL #2   Title Active left shoulder flexion to 150 degrees so the patient can easily reach overhead   Time 4   Period Weeks   Status Not Met  AROM 124 degrees with compensation 11/05/2015     PT LONG TERM GOAL #3   Title Active ER to 70 degrees+ to allow for easily donning/doffing of apparel   Time 4   Period Weeks   Status Not Met  AROM 33 degrees 11/05/2015     PT LONG TERM GOAL #4   Title Increase ROM so patient is able to reach behind back to L4 with left hand.   Time 4   Period Weeks   Status Not Met  Belt line (L5- S1) as of 11/05/2015     PT LONG TERM GOAL #5   Title Increase left shoulder strength to a solid 4+/5 to increase stability for performance of functional activities   Time 4   Period Weeks   Status Achieved  L shoulder MMT ranging from 4+/5 to 5/5 as of 11/05/2015     PT LONG TERM GOAL #6   Title Perform ADL's with pain not > 3/10.   Time 4   Period Weeks   Status Achieved             Patient will benefit from skilled therapeutic intervention in order to improve the following deficits and impairments:  Decreased range of motion, Decreased activity tolerance, Pain, Decreased strength  Visit Diagnosis: Stiffness of left shoulder, not elsewhere classified  Acute pain of left  shoulder     Problem List There are no active problems to display for this patient.   Ahmed Prima, PTA 11/06/15 6:03 PM  [facility] [place], [place], [address] Phone: [phone]   Fax:  [phone]  Name: Tarun Patchell MRN: [id number] Date of Birth: 05-22-1960  PHYSICAL THERAPY DISCHARGE SUMMARY  Visits from Start of Care: 30.  Current functional level related to goals / functional outcomes: Please see above.   Remaining deficits: Continued loss of left shoulder ROM.   Education / Equipment: HEP. Plan: Patient agrees to discharge.  Patient goals were partially met. Patient is being discharged due to the physician's request.  ?????         Mali Applegate MPT

## 2015-12-19 DIAGNOSIS — J069 Acute upper respiratory infection, unspecified: Secondary | ICD-10-CM | POA: Diagnosis not present

## 2015-12-19 DIAGNOSIS — Z1389 Encounter for screening for other disorder: Secondary | ICD-10-CM | POA: Diagnosis not present

## 2015-12-19 DIAGNOSIS — Z6832 Body mass index (BMI) 32.0-32.9, adult: Secondary | ICD-10-CM | POA: Diagnosis not present

## 2015-12-19 DIAGNOSIS — E6609 Other obesity due to excess calories: Secondary | ICD-10-CM | POA: Diagnosis not present

## 2016-10-02 DIAGNOSIS — Z23 Encounter for immunization: Secondary | ICD-10-CM | POA: Diagnosis not present

## 2017-02-19 DIAGNOSIS — E782 Mixed hyperlipidemia: Secondary | ICD-10-CM | POA: Diagnosis not present

## 2017-02-19 DIAGNOSIS — Z6832 Body mass index (BMI) 32.0-32.9, adult: Secondary | ICD-10-CM | POA: Diagnosis not present

## 2017-02-19 DIAGNOSIS — I10 Essential (primary) hypertension: Secondary | ICD-10-CM | POA: Diagnosis not present

## 2017-02-19 DIAGNOSIS — E6609 Other obesity due to excess calories: Secondary | ICD-10-CM | POA: Diagnosis not present

## 2017-02-19 DIAGNOSIS — R7309 Other abnormal glucose: Secondary | ICD-10-CM | POA: Diagnosis not present

## 2017-03-19 IMAGING — DX DG SHOULDER 2+V*L*
3 series · 3 of 3 positions shown · non-contrast
Comparison: None.

CLINICAL DATA: 54-year-old male with a history of pain

EXAM:
LEFT SHOULDER - 2+ VIEW

[shoulder grashey]
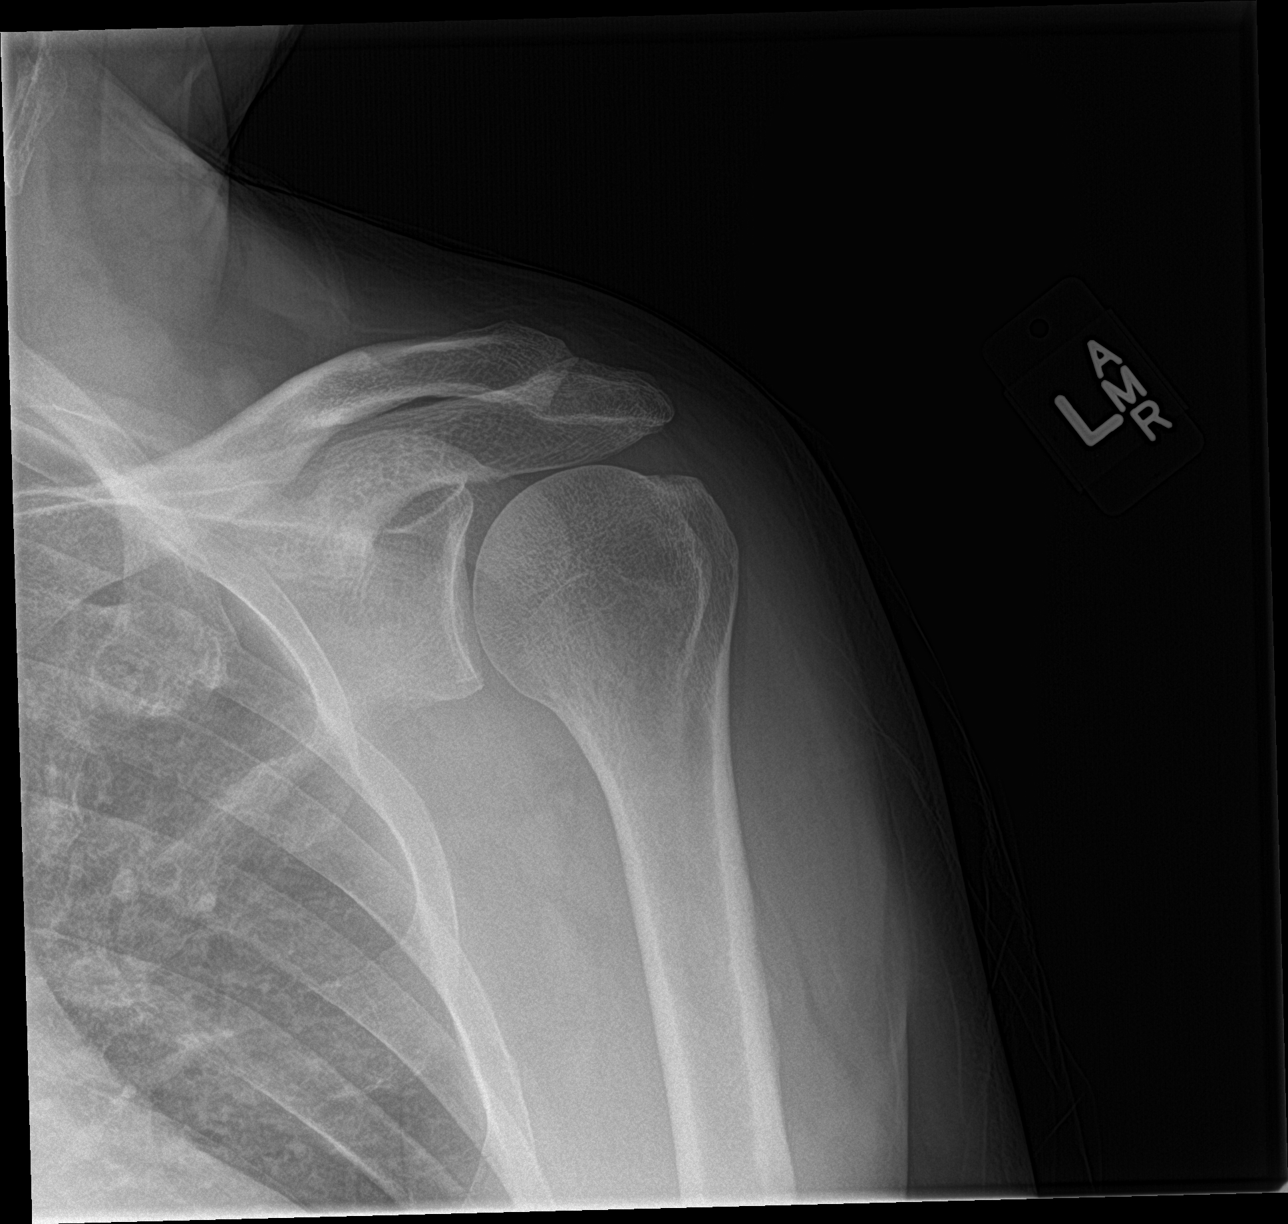

[shoulder y view]
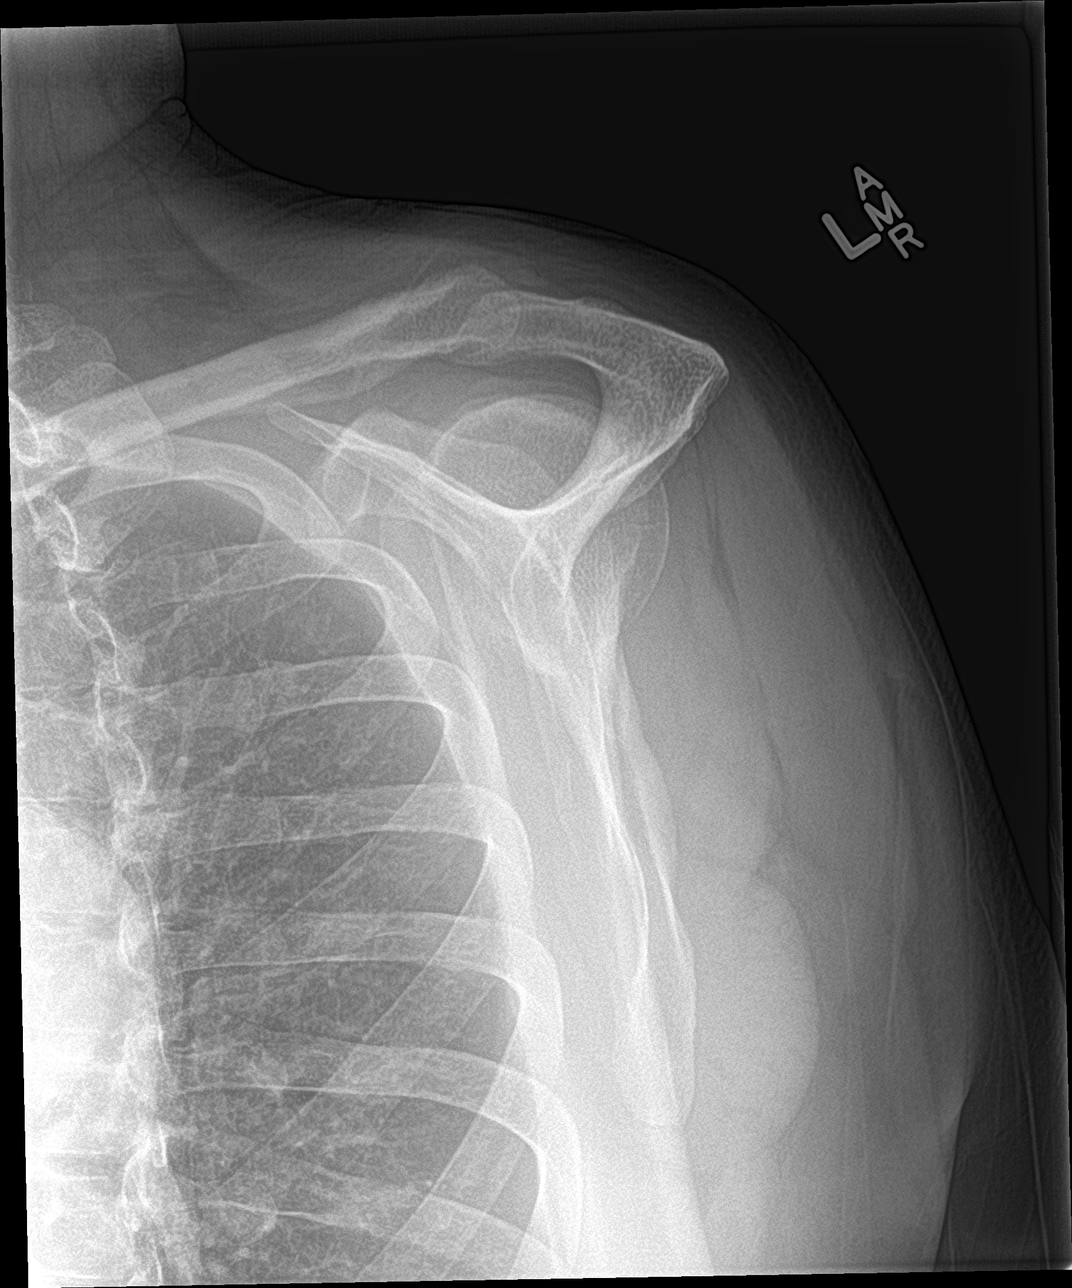

[shoulder axillary]
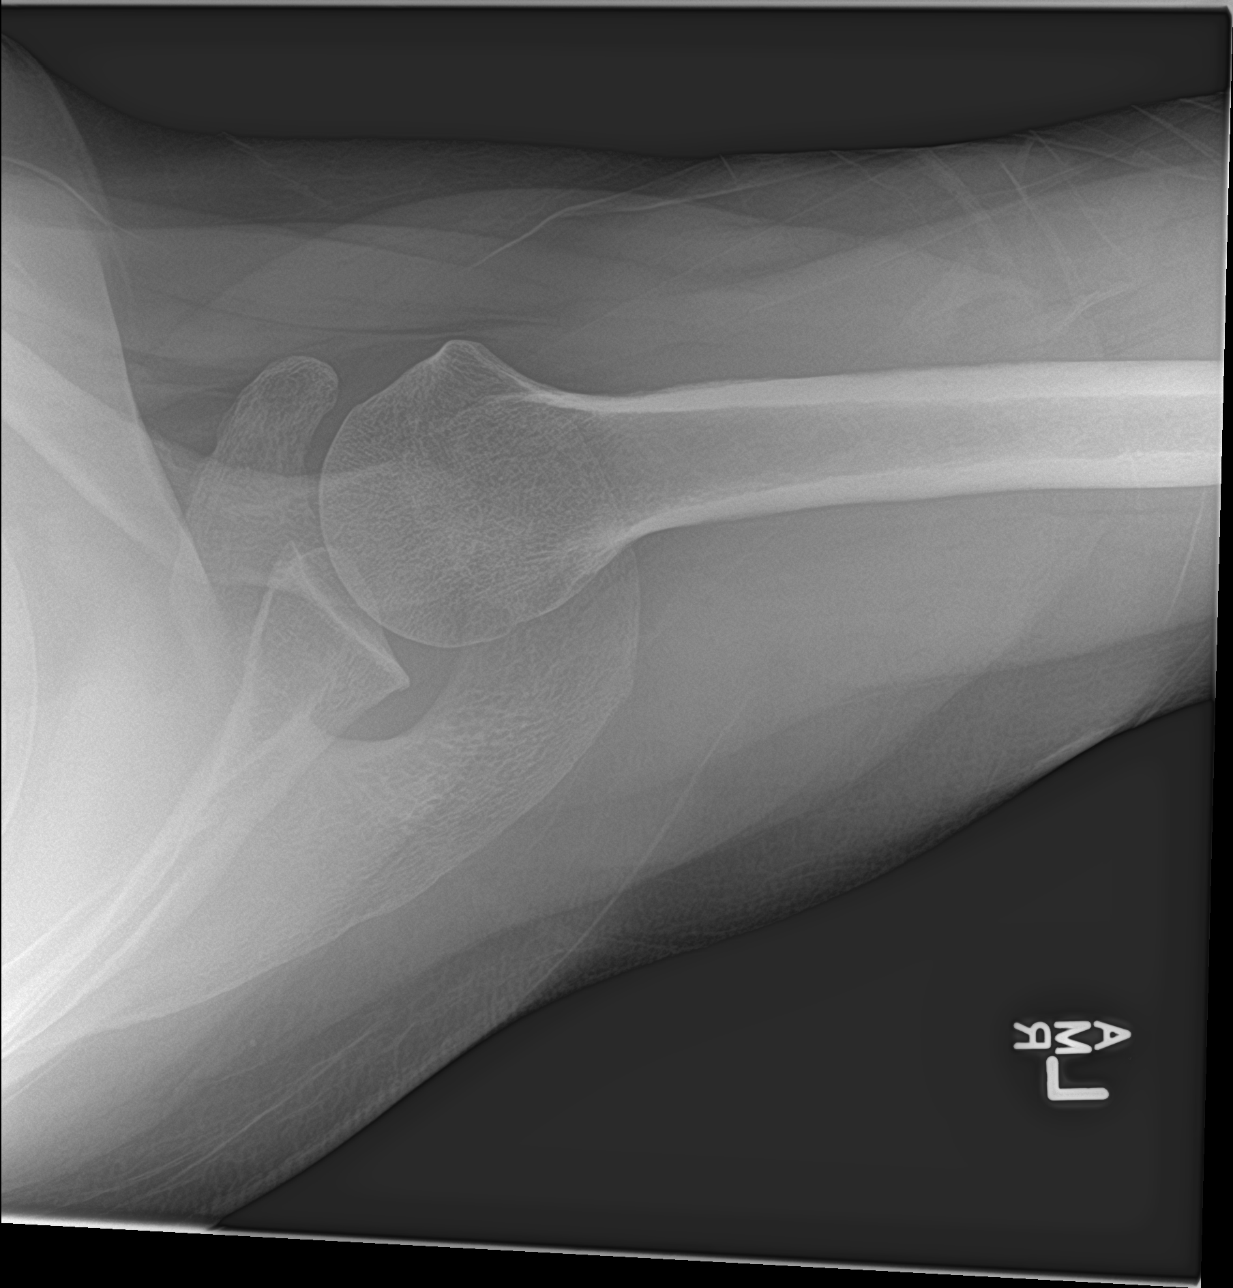

[3 of 3 positions shown; findings below may reference images not displayed]

FINDINGS: There is no evidence of fracture or dislocation. There is no
evidence of arthropathy or other focal bone abnormality. Soft
tissues are unremarkable.
IMPRESSION: Negative.

## 2017-10-20 DIAGNOSIS — Z23 Encounter for immunization: Secondary | ICD-10-CM | POA: Diagnosis not present

## 2017-11-24 ENCOUNTER — Other Ambulatory Visit: Payer: Self-pay

## 2017-11-24 ENCOUNTER — Encounter (HOSPITAL_COMMUNITY): Payer: Self-pay | Admitting: *Deleted

## 2017-11-24 ENCOUNTER — Emergency Department (HOSPITAL_COMMUNITY)
Admission: EM | Admit: 2017-11-24 | Discharge: 2017-11-24 | Disposition: A | Discharge status: Home / Self Care | Payer: BLUE CROSS/BLUE SHIELD | Attending: Emergency Medicine | Admitting: Emergency Medicine

## 2017-11-24 ENCOUNTER — Emergency Department (HOSPITAL_COMMUNITY): Payer: BLUE CROSS/BLUE SHIELD

## 2017-11-24 DIAGNOSIS — K5792 Diverticulitis of intestine, part unspecified, without perforation or abscess without bleeding: Secondary | ICD-10-CM | POA: Diagnosis not present

## 2017-11-24 DIAGNOSIS — J45909 Unspecified asthma, uncomplicated: Secondary | ICD-10-CM | POA: Diagnosis not present

## 2017-11-24 DIAGNOSIS — I10 Essential (primary) hypertension: Secondary | ICD-10-CM | POA: Insufficient documentation

## 2017-11-24 DIAGNOSIS — N200 Calculus of kidney: Secondary | ICD-10-CM | POA: Diagnosis not present

## 2017-11-24 DIAGNOSIS — R1030 Lower abdominal pain, unspecified: Secondary | ICD-10-CM | POA: Diagnosis present

## 2017-11-24 HISTORY — DX: Unspecified asthma, uncomplicated: J45.909

## 2017-11-24 HISTORY — DX: Disorder of kidney and ureter, unspecified: N28.9

## 2017-11-24 HISTORY — DX: Essential (primary) hypertension: I10

## 2017-11-24 LAB — CBC WITH DIFFERENTIAL/PLATELET
ABS IMMATURE GRANULOCYTES: 0.03 10*3/uL (ref 0.00–0.07)
BASOS ABS: 0 10*3/uL (ref 0.0–0.1)
Basophils Relative: 0 %
EOS ABS: 0.2 10*3/uL (ref 0.0–0.5)
Eosinophils Relative: 3 %
HEMATOCRIT: 48.3 % (ref 39.0–52.0)
Hemoglobin: 15.5 g/dL (ref 13.0–17.0)
Immature Granulocytes: 0 %
LYMPHS ABS: 1.3 10*3/uL (ref 0.7–4.0)
LYMPHS PCT: 14 %
MCH: 28.6 pg (ref 26.0–34.0)
MCHC: 32.1 g/dL (ref 30.0–36.0)
MCV: 89.1 fL (ref 80.0–100.0)
MONOS PCT: 9 %
Monocytes Absolute: 0.8 10*3/uL (ref 0.1–1.0)
NEUTROS ABS: 6.4 10*3/uL (ref 1.7–7.7)
NRBC: 0 % (ref 0.0–0.2)
Neutrophils Relative %: 74 %
Platelets: 191 10*3/uL (ref 150–400)
RBC: 5.42 MIL/uL (ref 4.22–5.81)
RDW: 13.2 % (ref 11.5–15.5)
WBC: 8.7 10*3/uL (ref 4.0–10.5)

## 2017-11-24 LAB — URINALYSIS, ROUTINE W REFLEX MICROSCOPIC
BACTERIA UA: NONE SEEN
BILIRUBIN URINE: NEGATIVE
Glucose, UA: NEGATIVE mg/dL
Ketones, ur: NEGATIVE mg/dL
LEUKOCYTES UA: NEGATIVE
Nitrite: NEGATIVE
Protein, ur: NEGATIVE mg/dL
SPECIFIC GRAVITY, URINE: 1.019 (ref 1.005–1.030)
pH: 5 (ref 5.0–8.0)

## 2017-11-24 LAB — BASIC METABOLIC PANEL
ANION GAP: 7 (ref 5–15)
BUN: 15 mg/dL (ref 6–20)
CHLORIDE: 104 mmol/L (ref 98–111)
CO2: 26 mmol/L (ref 22–32)
Calcium: 8.6 mg/dL — ABNORMAL LOW (ref 8.9–10.3)
Creatinine, Ser: 1.09 mg/dL (ref 0.61–1.24)
GFR calc non Af Amer: >60 mL/min (ref >60)
GLUCOSE: 104 mg/dL — AB (ref 70–99)
POTASSIUM: 3.7 mmol/L (ref 3.5–5.1)
Sodium: 137 mmol/L (ref 135–145)

## 2017-11-24 MED ORDER — METRONIDAZOLE 500 MG PO TABS: 500.0000 mg | ORAL_TABLET | Freq: Three times a day (TID) | ORAL | 0 refills | Status: DC

## 2017-11-24 MED ORDER — CIPROFLOXACIN HCL 250 MG PO TABS
500.0000 mg | ORAL_TABLET | Freq: Once | ORAL | Status: AC
  Administered 2017-11-24: 500 mg via ORAL
  Filled 2017-11-24: qty 2

## 2017-11-24 MED ORDER — HYDROCODONE-ACETAMINOPHEN 5-325 MG PO TABS: 1.0000 | ORAL_TABLET | Freq: Four times a day (QID) | ORAL | 0 refills | Status: DC | PRN

## 2017-11-24 MED ORDER — ONDANSETRON HCL 4 MG PO TABS: 4.0000 mg | ORAL_TABLET | Freq: Three times a day (TID) | ORAL | 0 refills | Status: DC | PRN

## 2017-11-24 MED ORDER — METRONIDAZOLE 500 MG PO TABS
500.0000 mg | ORAL_TABLET | Freq: Once | ORAL | Status: AC
  Administered 2017-11-24: 500 mg via ORAL
  Filled 2017-11-24: qty 1

## 2017-11-24 MED ORDER — CIPROFLOXACIN HCL 500 MG PO TABS: 500.0000 mg | ORAL_TABLET | Freq: Two times a day (BID) | ORAL | 0 refills | Status: DC

## 2017-11-24 MED ORDER — KETOROLAC TROMETHAMINE 30 MG/ML IJ SOLN
30.0000 mg | Freq: Once | INTRAMUSCULAR | Status: AC
  Administered 2017-11-24: 30 mg via INTRAVENOUS
  Filled 2017-11-24: qty 1

## 2017-11-24 NOTE — Discharge Instructions (Addendum)
Take the antibiotics until gone.  Take the hydrocodone for pain as needed.  Use the Zofran if you have nausea or vomiting.  Please call Dr. Lamar Blinks office today to let them know about your ED visit.  They will probably want to check you in the office on Friday, November 8.  Today follow a liquid diet and possibly tomorrow.  If you are doing well you can have a bland diet such as toast, crackers, Jell-O, [place] chicken noodle soup.  Return to the ED if you get a high fever, worsening pain, or develop uncontrolled vomiting.

## 2017-11-24 NOTE — ED Triage Notes (Signed)
Pt c/o suprapubic pain x one week; pt states the pain started out in his back and has radiated around to his abdomen; pt has a hx of kidney stones; pt denies any blood in urine

## 2017-11-24 NOTE — ED Notes (Signed)
Bladder scan showed less than 50mL of urine, pt did urinate when assigned to room.

## 2017-11-24 NOTE — ED Provider Notes (Signed)
[facility] EMERGENCY DEPARTMENT Provider Note   CSN: [id number] Arrival date & time: 11/24/17  0349  Time seen 04:35 AM   History   Chief Complaint Chief Complaint  Patient presents with  . Abdominal Pain    HPI Jamie Russell is a 57 y.o. male.  HPI patient states October 29 he started having some low back pain.  The following day he had some suprapubic pain.  He then started having pain on both sides of his abdomen is states the left is worse than the right.  He denies dysuria but has been having frequency but he has also been drinking extra cranberry juice.  He denies hematuria.  He states the pain lasts 3 to 5 minutes.  He states at 1230 it woke him up.  He states his pain is almost gone now.  Nothing he does makes it hurt more including movement, nothing makes it feel better.  He denies nausea, vomiting, or fever.  He states that he has a history of kidney stones and he has passed at least a dozen stones.  He had lithotripsy and a stent placed once 20 years ago when he had a large stone.  He states about 1 week ago at work they were short staffed and he was made to do physical work that he is not used to doing.  Patient states he used to have hypertension however he stopped his hypertension medication several years ago and his blood pressure has been well controlled.  PCP Assunta Found, MD Urology none  Past Medical History:  Diagnosis Date  . Asthma   . Hypertension   . Renal disorder    kidney stones    There are no active problems to display for this patient.   Past Surgical History:  Procedure Laterality Date  . SHOULDER ARTHROSCOPY WITH ROTATOR CUFF REPAIR Left   . URETERAL STENT PLACEMENT Left         Home Medications    Prior to Admission medications   Medication Sig Start Date End Date Taking? Authorizing Provider  ciprofloxacin (CIPRO) 500 MG tablet Take 1 tablet (500 mg total) by mouth 2 (two) times daily. 11/24/17   Devoria Albe, MD    HYDROcodone-acetaminophen (NORCO/VICODIN) 5-325 MG tablet Take 1 tablet by mouth every 6 (six) hours as needed for moderate pain or severe pain. 11/24/17   Devoria Albe, MD  metroNIDAZOLE (FLAGYL) 500 MG tablet Take 1 tablet (500 mg total) by mouth 3 (three) times daily. 11/24/17   Devoria Albe, MD  ondansetron (ZOFRAN) 4 MG tablet Take 1 tablet (4 mg total) by mouth every 8 (eight) hours as needed for nausea or vomiting. 11/24/17   Devoria Albe, MD    Family History History reviewed. No pertinent family history.  Social History Social History   Tobacco Use  . Smoking status: Never Smoker  . Smokeless tobacco: Never Used  Substance Use Topics  . Alcohol use: Yes    Comment: occasionally  . Drug use: Never  employed Lives with spouse   Allergies   Patient has no known allergies.   Review of Systems Review of Systems  All other systems reviewed and are negative.    Physical Exam Updated Vital Signs BP 126/87 (BP Location: Right Arm)   Pulse (!) 56   Temp 98.2 F (36.8 C) (Oral)   Resp 18   Ht 5\' 9"  (1.753 m)   Wt 97.5 kg   SpO2 96%   BMI 31.75 kg/m   Vital signs  normal    Physical Exam  Constitutional: He is oriented to person, place, and time. He appears well-developed and well-nourished.  Non-toxic appearance. He does not appear ill. No distress.  HENT:  Head: Normocephalic and atraumatic.  Right Ear: External ear normal.  Left Ear: External ear normal.  Nose: Nose normal. No mucosal edema or rhinorrhea.  Mouth/Throat: Oropharynx is clear and moist and mucous membranes are normal. No dental abscesses or uvula swelling.  Eyes: Pupils are equal, round, and reactive to light. Conjunctivae and EOM are normal.  Neck: Normal range of motion and full passive range of motion without pain. Neck supple.  Cardiovascular: Normal rate, regular rhythm and normal heart sounds. Exam reveals no gallop and no friction rub.  No murmur heard. Pulmonary/Chest: Effort normal and breath  sounds normal. No respiratory distress. He has no wheezes. He has no rhonchi. He has no rales. He exhibits no tenderness and no crepitus.  Abdominal: Soft. Normal appearance and bowel sounds are normal. He exhibits no distension. There is tenderness in the right lower quadrant and suprapubic area. There is no rebound and no guarding.  Musculoskeletal: Normal range of motion. He exhibits no edema or tenderness.       Back:  Moves all extremities well.  Patient has no pain when he does range of motion of the lumbar spine.  He does indicate his back pain he had is in the sacral area.  Neurological: He is alert and oriented to person, place, and time. He has normal strength. No cranial nerve deficit.  Skin: Skin is warm, dry and intact. No rash noted. No erythema. No pallor.  Psychiatric: He has a normal mood and affect. His speech is normal and behavior is normal. His mood appears not anxious.  Nursing note and vitals reviewed.    ED Treatments / Results  Labs (all labs ordered are listed, but only abnormal results are displayed) Results for orders placed or performed during the hospital encounter of 11/24/17  Urinalysis, Routine w reflex microscopic  Result Value Ref Range   Color, Urine YELLOW YELLOW   APPearance CLEAR CLEAR   Specific Gravity, Urine 1.019 1.005 - 1.030   pH 5.0 5.0 - 8.0   Glucose, UA NEGATIVE NEGATIVE mg/dL   Hgb urine dipstick SMALL (A) NEGATIVE   Bilirubin Urine NEGATIVE NEGATIVE   Ketones, ur NEGATIVE NEGATIVE mg/dL   Protein, ur NEGATIVE NEGATIVE mg/dL   Nitrite NEGATIVE NEGATIVE   Leukocytes, UA NEGATIVE NEGATIVE   WBC, UA 0-5 0 - 5 WBC/hpf   Bacteria, UA NONE SEEN NONE SEEN   Mucus PRESENT   Basic metabolic panel  Result Value Ref Range   Sodium 137 135 - 145 mmol/L   Potassium 3.7 3.5 - 5.1 mmol/L   Chloride 104 98 - 111 mmol/L   CO2 26 22 - 32 mmol/L   Glucose, Bld 104 (H) 70 - 99 mg/dL   BUN 15 6 - 20 mg/dL   Creatinine, Ser 4.69 0.61 - 1.24 mg/dL     Calcium 8.6 (L) 8.9 - 10.3 mg/dL   GFR calc non Af Amer >60 >60 mL/min   GFR calc Af Amer >60 >60 mL/min   Anion gap 7 5 - 15  CBC with Differential  Result Value Ref Range   WBC 8.7 4.0 - 10.5 K/uL   RBC 5.42 4.22 - 5.81 MIL/uL   Hemoglobin 15.5 13.0 - 17.0 g/dL   HCT 62.9 52.8 - 41.3 %   MCV 89.1 80.0 - 100.0  fL   MCH 28.6 26.0 - 34.0 pg   MCHC 32.1 30.0 - 36.0 g/dL   RDW 16.1 09.6 - 04.5 %   Platelets 191 150 - 400 K/uL   nRBC 0.0 0.0 - 0.2 %   Neutrophils Relative % 74 %   Neutro Abs 6.4 1.7 - 7.7 K/uL   Lymphocytes Relative 14 %   Lymphs Abs 1.3 0.7 - 4.0 K/uL   Monocytes Relative 9 %   Monocytes Absolute 0.8 0.1 - 1.0 K/uL   Eosinophils Relative 3 %   Eosinophils Absolute 0.2 0.0 - 0.5 K/uL   Basophils Relative 0 %   Basophils Absolute 0.0 0.0 - 0.1 K/uL   Immature Granulocytes 0 %   Abs Immature Granulocytes 0.03 0.00 - 0.07 K/uL   Laboratory interpretation all normal except positive dip for blood but no red blood cells seen on microscopic    EKG None  Radiology Ct Renal Stone Study  Result Date: 11/24/2017 CLINICAL DATA:  Suprapubic pain for 1 week. History of kidney stones. EXAM: CT ABDOMEN AND PELVIS WITHOUT CONTRAST TECHNIQUE: Multidetector CT imaging of the abdomen and pelvis was performed following the standard protocol without IV contrast. COMPARISON:  09/10/2009 FINDINGS: Lower chest: Lung bases are clear.  Small esophageal hiatal hernia. Hepatobiliary: No focal liver abnormality is seen. No gallstones, gallbladder wall thickening, or biliary dilatation. Pancreas: Unremarkable. No pancreatic ductal dilatation or surrounding inflammatory changes. Spleen: Normal in size without focal abnormality. Adrenals/Urinary Tract: No adrenal gland nodules. Two stones in the lower pole right kidney, largest measuring 9 mm diameter. Additional tiny punctate calcifications. No hydronephrosis or hydroureter. No ureteral stones. No bladder stones or bladder wall thickening.  Stomach/Bowel: Stomach, small bowel, and colon are not abnormally distended. Diverticula in the sigmoid colon. Infiltration around the upper sigmoid region consistent with focal diverticulitis. No abscess. Appendix is normal. Vascular/Lymphatic: Aortic atherosclerosis. No enlarged abdominal or pelvic lymph nodes. Reproductive: Prostate is unremarkable. Other: No abdominal wall hernia or abnormality. No abdominopelvic ascites. Musculoskeletal: No acute or significant osseous findings. IMPRESSION: 1. Diverticulosis of sigmoid colon with focal acute diverticulitis changes. No abscess. 2. Nonobstructing stones in the right kidney. Electronically Signed   By: Burman Nieves M.D.   On: 11/24/2017 05:29    Procedures Procedures (including critical care time)  Medications Ordered in ED Medications  ketorolac (TORADOL) 30 MG/ML injection 30 mg (30 mg Intravenous Given 11/24/17 0455)  ciprofloxacin (CIPRO) tablet 500 mg (500 mg Oral Given 11/24/17 0551)  metroNIDAZOLE (FLAGYL) tablet 500 mg (500 mg Oral Given 11/24/17 0551)     Initial Impression / Assessment and Plan / ED Course  I have reviewed the triage vital signs and the nursing notes.  Pertinent labs & imaging results that were available during my care of the patient were reviewed by me and considered in my medical decision making (see chart for details).    Patient was given Toradol for pain.  We discussed his urinalysis which could be indicative of a kidney stone but there is not much blood seen.  He had a CT renal done.  When I look in radiology he did have a CT scan of his abdomen and pelvis done in 2012 when he had a right ureteral stone.  5:45 AM patient states his pain is better.  He had he has never had diverticulitis before.  Patient is not having fever, vomiting, or diarrhea or blood in his stool so he can be treated as an outpatient.  He was started on  oral Cipro and Flagyl in the ED.  Review of the [place] database  shows patient had 3 controlled substance prescriptions in 2017 and none since.  Final Clinical Impressions(s) / ED Diagnoses   Final diagnoses:  Diverticulitis    ED Discharge Orders         Ordered    ciprofloxacin (CIPRO) 500 MG tablet  2 times daily     11/24/17 0556    metroNIDAZOLE (FLAGYL) 500 MG tablet  3 times daily     11/24/17 0556    HYDROcodone-acetaminophen (NORCO/VICODIN) 5-325 MG tablet  Every 6 hours PRN     11/24/17 0556    ondansetron (ZOFRAN) 4 MG tablet  Every 8 hours PRN     11/24/17 0556         Plan discharge  Devoria Albe, MD, Concha Pyo, MD 11/24/17 0600

## 2017-11-26 DIAGNOSIS — Z1389 Encounter for screening for other disorder: Secondary | ICD-10-CM | POA: Diagnosis not present

## 2017-11-26 DIAGNOSIS — K5732 Diverticulitis of large intestine without perforation or abscess without bleeding: Secondary | ICD-10-CM | POA: Diagnosis not present

## 2017-11-26 DIAGNOSIS — E6609 Other obesity due to excess calories: Secondary | ICD-10-CM | POA: Diagnosis not present

## 2017-11-26 DIAGNOSIS — Z6831 Body mass index (BMI) 31.0-31.9, adult: Secondary | ICD-10-CM | POA: Diagnosis not present

## 2017-12-21 DIAGNOSIS — K5792 Diverticulitis of intestine, part unspecified, without perforation or abscess without bleeding: Secondary | ICD-10-CM | POA: Diagnosis not present

## 2017-12-21 DIAGNOSIS — Z6831 Body mass index (BMI) 31.0-31.9, adult: Secondary | ICD-10-CM | POA: Diagnosis not present

## 2017-12-29 DIAGNOSIS — Z87442 Personal history of urinary calculi: Secondary | ICD-10-CM | POA: Diagnosis not present

## 2017-12-29 DIAGNOSIS — K5730 Diverticulosis of large intestine without perforation or abscess without bleeding: Secondary | ICD-10-CM | POA: Diagnosis not present

## 2017-12-29 DIAGNOSIS — K5732 Diverticulitis of large intestine without perforation or abscess without bleeding: Secondary | ICD-10-CM | POA: Diagnosis not present

## 2017-12-29 DIAGNOSIS — Z91018 Allergy to other foods: Secondary | ICD-10-CM | POA: Diagnosis not present

## 2018-01-04 DIAGNOSIS — Z6830 Body mass index (BMI) 30.0-30.9, adult: Secondary | ICD-10-CM | POA: Diagnosis not present

## 2018-01-04 DIAGNOSIS — Z0000 Encounter for general adult medical examination without abnormal findings: Secondary | ICD-10-CM | POA: Diagnosis not present

## 2018-01-04 DIAGNOSIS — E782 Mixed hyperlipidemia: Secondary | ICD-10-CM | POA: Diagnosis not present

## 2018-01-04 DIAGNOSIS — K5730 Diverticulosis of large intestine without perforation or abscess without bleeding: Secondary | ICD-10-CM | POA: Diagnosis not present

## 2018-01-04 DIAGNOSIS — J45909 Unspecified asthma, uncomplicated: Secondary | ICD-10-CM | POA: Diagnosis not present

## 2018-01-04 DIAGNOSIS — I10 Essential (primary) hypertension: Secondary | ICD-10-CM | POA: Diagnosis not present

## 2018-08-08 DIAGNOSIS — Z1389 Encounter for screening for other disorder: Secondary | ICD-10-CM | POA: Diagnosis not present

## 2018-08-08 DIAGNOSIS — Z6831 Body mass index (BMI) 31.0-31.9, adult: Secondary | ICD-10-CM | POA: Diagnosis not present

## 2018-08-08 DIAGNOSIS — M779 Enthesopathy, unspecified: Secondary | ICD-10-CM | POA: Diagnosis not present

## 2018-08-08 DIAGNOSIS — E6609 Other obesity due to excess calories: Secondary | ICD-10-CM | POA: Diagnosis not present

## 2018-10-05 DIAGNOSIS — Z23 Encounter for immunization: Secondary | ICD-10-CM | POA: Diagnosis not present

## 2019-07-07 DIAGNOSIS — E6609 Other obesity due to excess calories: Secondary | ICD-10-CM | POA: Diagnosis not present

## 2019-07-07 DIAGNOSIS — J069 Acute upper respiratory infection, unspecified: Secondary | ICD-10-CM | POA: Diagnosis not present

## 2019-07-07 DIAGNOSIS — Z6832 Body mass index (BMI) 32.0-32.9, adult: Secondary | ICD-10-CM | POA: Diagnosis not present

## 2019-09-30 IMAGING — CT CT RENAL STONE PROTOCOL
2 of 4 series · 16 of 46 positions shown, 18 images · non-contrast
Comparison: 09/10/2009

CLINICAL DATA: Suprapubic pain for 1 week. History of kidney
stones.

EXAM:
CT ABDOMEN AND PELVIS WITHOUT CONTRAST
TECHNIQUE: Multidetector CT imaging of the abdomen and pelvis was performed
following the standard protocol without IV contrast.

[Series 2: axial st · axial · 0.86mm/px · z∈[+982,+1392]mm · 13 of 90 slices shown, 15 images]
[im 4/90  soft-tissue]
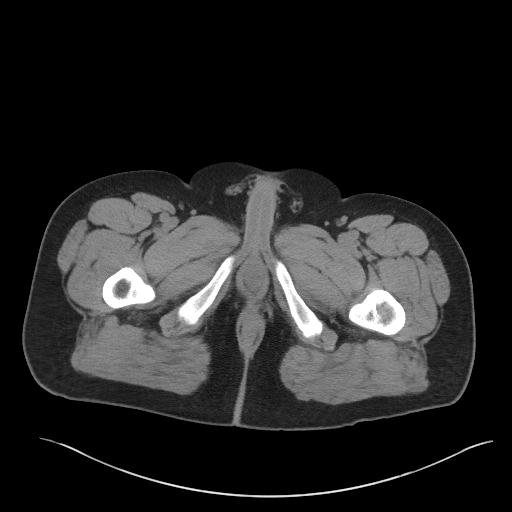
[im 4/90  bone]
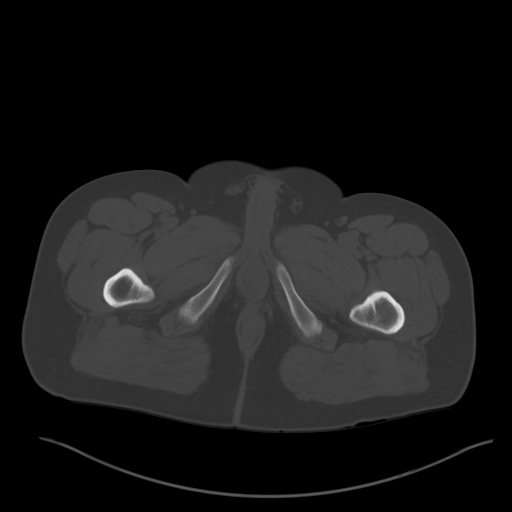
[im 12/90  soft-tissue]
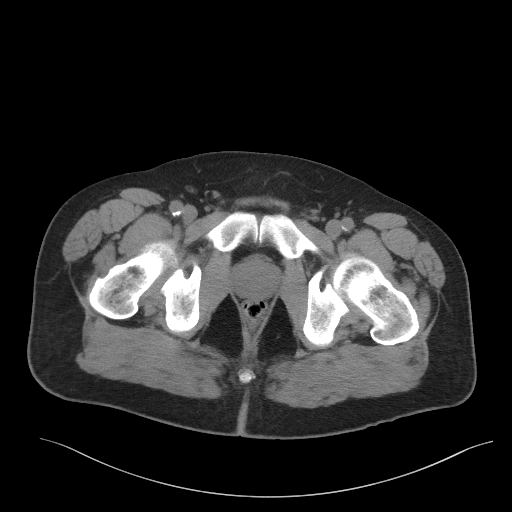
[im 20/90  soft-tissue]
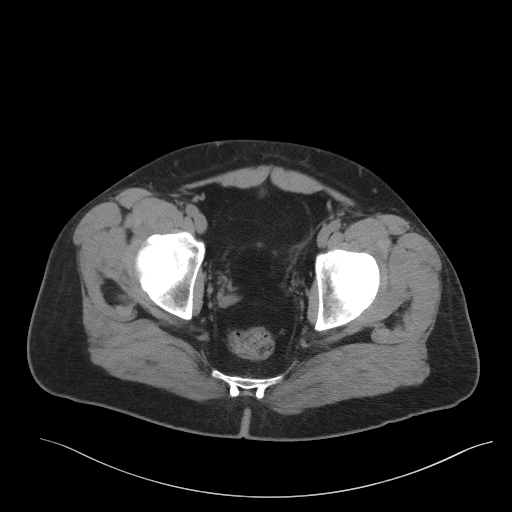
[im 24/90  soft-tissue]
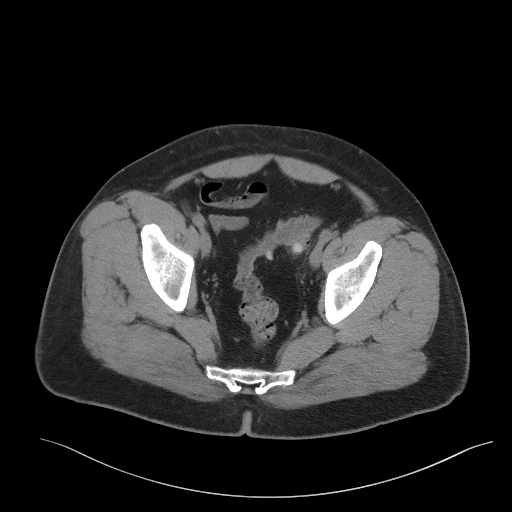
[im 31/90  soft-tissue]
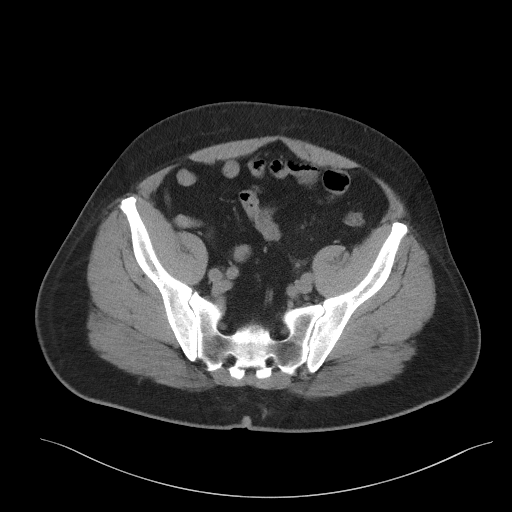
[im 39/90  soft-tissue]
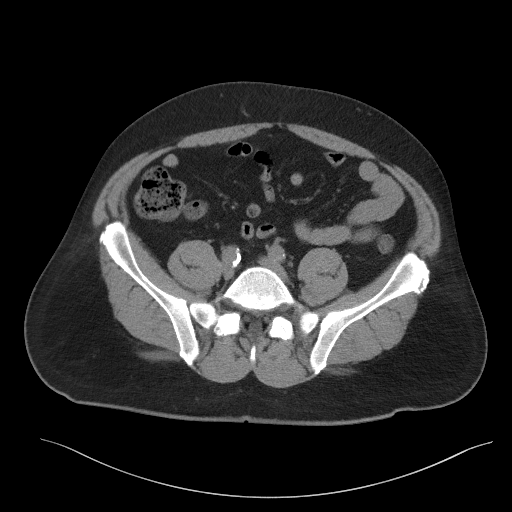
[im 47/90  soft-tissue]
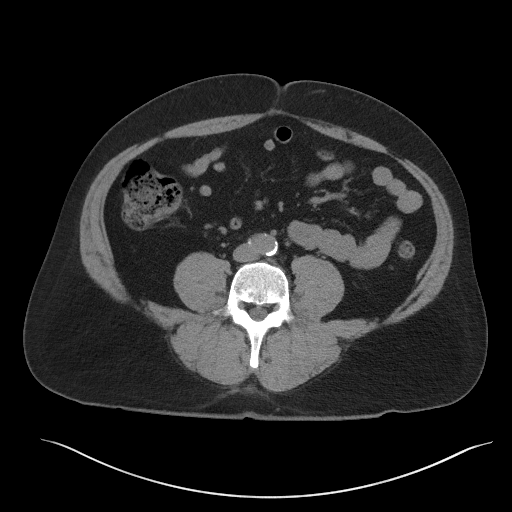
[im 51/90  soft-tissue]
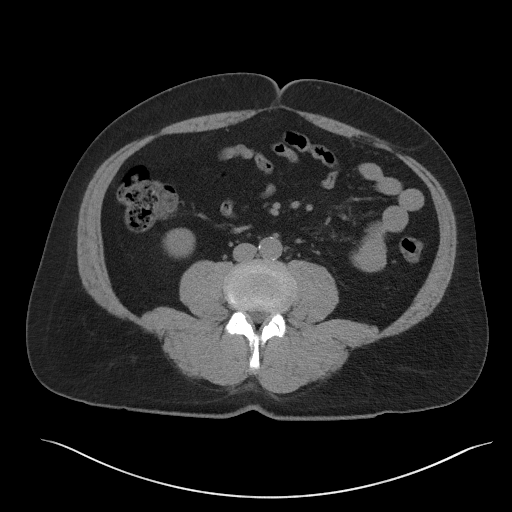
[im 59/90  soft-tissue]
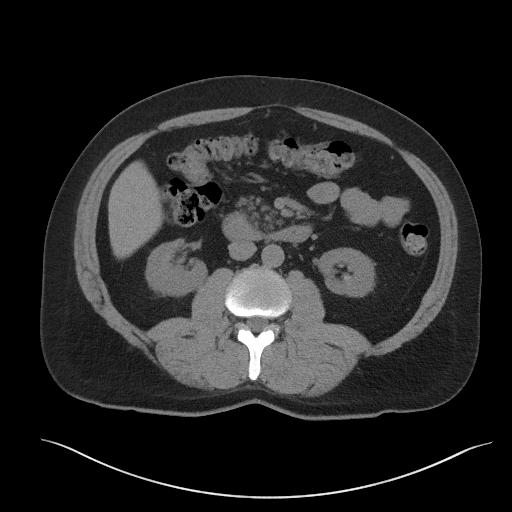
[im 59/90  bone]
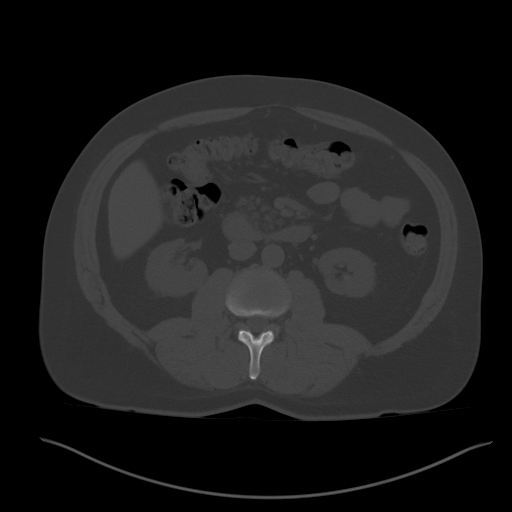
[im 66/90  soft-tissue]
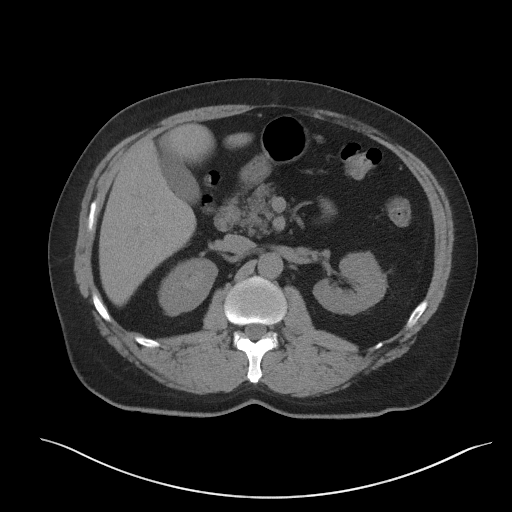
[im 70/90  soft-tissue]
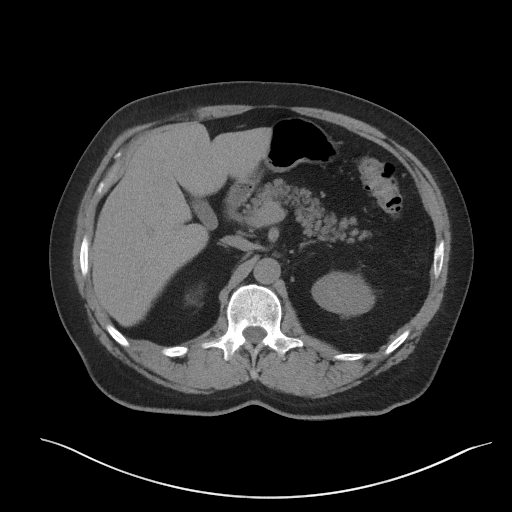
[im 78/90  soft-tissue]
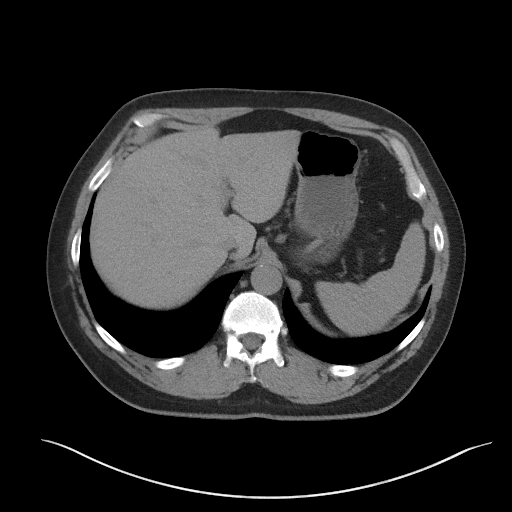
[im 86/90  soft-tissue]
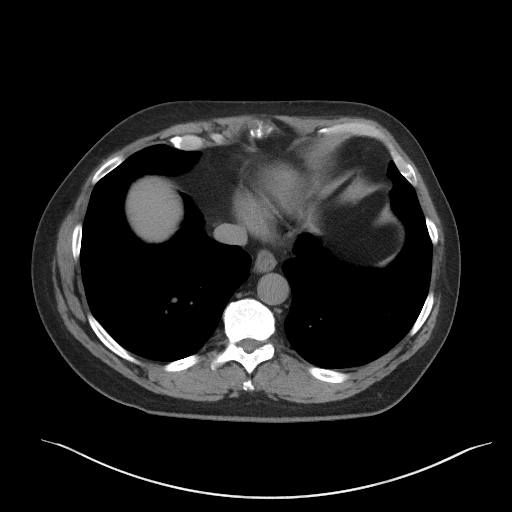

[Series 5: coronal st · coronal · 0.75mm/px · 3 of 101 slices shown]
[im 34/101  soft-tissue]
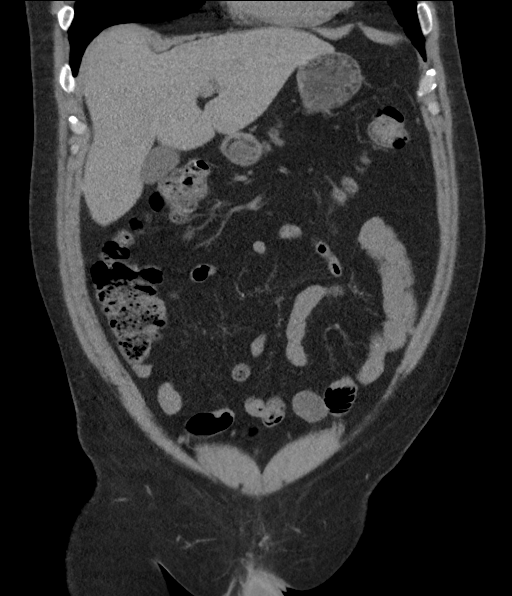
[im 45/101  soft-tissue]
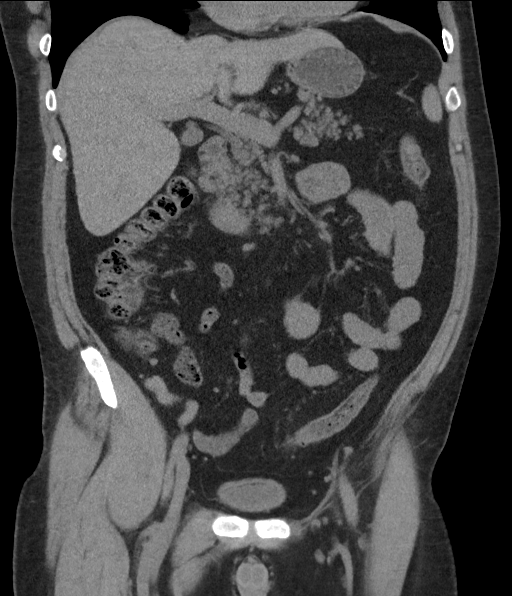
[im 56/101  soft-tissue]
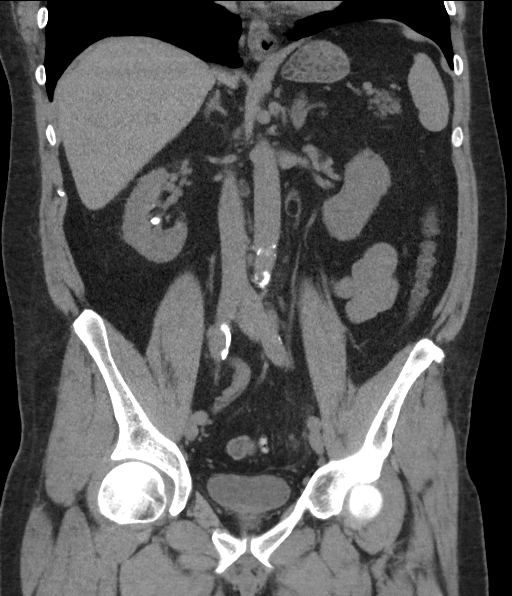

[16 of 46 positions shown; findings below may reference images not displayed]

FINDINGS: Lower chest: Lung bases are clear.  Small esophageal hiatal hernia.

Hepatobiliary: No focal liver abnormality is seen. No gallstones,
gallbladder wall thickening, or biliary dilatation.

Pancreas: Unremarkable. No pancreatic ductal dilatation or
surrounding inflammatory changes.

Spleen: Normal in size without focal abnormality.

Adrenals/Urinary Tract: No adrenal gland nodules. Two stones in the
lower pole right kidney, largest measuring 9 mm diameter. Additional
tiny punctate calcifications. No hydronephrosis or hydroureter. No
ureteral stones. No bladder stones or bladder wall thickening.

Stomach/Bowel: Stomach, small bowel, and colon are not abnormally
distended. Diverticula in the sigmoid colon. Infiltration around the
upper sigmoid region consistent with focal diverticulitis. No
abscess. Appendix is normal.

Vascular/Lymphatic: Aortic atherosclerosis. No enlarged abdominal or
pelvic lymph nodes.

Reproductive: Prostate is unremarkable.

Other: No abdominal wall hernia or abnormality. No abdominopelvic
ascites.

Musculoskeletal: No acute or significant osseous findings.
IMPRESSION: 1. Diverticulosis of sigmoid colon with focal acute diverticulitis
changes. No abscess.
2. Nonobstructing stones in the right kidney.

## 2020-12-03 DIAGNOSIS — S52509A Unspecified fracture of the lower end of unspecified radius, initial encounter for closed fracture: Secondary | ICD-10-CM | POA: Insufficient documentation

## 2021-01-23 DIAGNOSIS — S4991XA Unspecified injury of right shoulder and upper arm, initial encounter: Secondary | ICD-10-CM | POA: Insufficient documentation

## 2021-06-03 ENCOUNTER — Ambulatory Visit: Payer: No Typology Code available for payment source | Attending: Physician Assistant

## 2021-06-03 DIAGNOSIS — M25611 Stiffness of right shoulder, not elsewhere classified: Secondary | ICD-10-CM | POA: Diagnosis present

## 2021-06-03 DIAGNOSIS — M25511 Pain in right shoulder: Secondary | ICD-10-CM | POA: Insufficient documentation

## 2021-06-03 NOTE — Therapy (Signed)
[place] [facility] ?[address] ?[place], [place], [address] ?Phone: [phone]   Fax:  [phone] ? ?Physical Therapy Evaluation ? ?Patient Details  ?Name: Jamie SnowballHoward Craze ?MRN: [id number] ?Date of Birth: May 15, 1960 ?Referring Provider (PT): Durwin Noraixon, PA-C ? ? ?Encounter Date: 06/03/2021 ? ? PT End of Session - 06/03/21 1344   ? ? Visit Number 1   ? Number of Visits 12   ? Date for PT Re-Evaluation 07/04/21   ? PT Start Time 1346   ? PT Stop Time 1430   ? PT Time Calculation (min) 44 min   ? Activity Tolerance Patient tolerated treatment well   ? [id number] for tasks assessed/performed   ? ?  ?  ? ?  ? ? ?Past Medical History:  ?Diagnosis Date  ? Asthma   ? Hypertension   ? Renal disorder   ? kidney stones  ? ? ?Past Surgical History:  ?Procedure Laterality Date  ? SHOULDER ARTHROSCOPY WITH ROTATOR CUFF REPAIR Left   ? URETERAL STENT PLACEMENT Left   ? ? ?There were no vitals filed for this visit. ? ? ? Subjective Assessment - 06/03/21 1347   ? ? Subjective Patient reports that he slipped off a machine at work and fell onto his right wrist and shoulder on 11/25/20 which fractured his wrist. He had therapy on his right wrist which went well. He then had surgery on his right shoulder yesterday (06/02/21). He was told to do pendulums, sliding his right hand across his leg, and hitch hikers. He notes that when he tried doing this last night, but that was really hard due to the nerve block. However, he feels that this has worn off now. He notes that this is the first time wearing his sling since he got home from surgery.   ? Pertinent History L shoulder surgery (2017)   ? Limitations Lifting   ? Patient Stated Goals return to work, using his RUE, reduced pain   ? Currently in Pain? Yes   ? Pain Score 7    ? Pain Location Shoulder   ? Pain Orientation Right   ? Pain Descriptors / Indicators Sore   ? Pain Type Surgical pain   ? Pain Radiating Towards localized right shoulder    ? Pain Onset Yesterday   ? Pain Frequency Constant   ? Aggravating Factors  pendulums,   ? Pain Relieving Factors ice, medication   ? Effect of Pain on Daily Activities unable to use his RUE for his work or daily activities   ? ?  ?  ? ?  ? ? ? ? ? [facility] PT Assessment - 06/03/21 0001   ? ?  ? Assessment  ? Medical Diagnosis Pain in right shoulder   ? Referring Provider (PT) Durwin Noraixon, PA-C   ? Onset Date/Surgical Date 06/02/21   ? Hand Dominance Right   ? Next MD Visit 06/17/21   ? Prior Therapy Yes, for right wrist   ?  ? Precautions  ? Precautions Shoulder   ? Type of Shoulder Precautions No precautions reported by patient   ?  ? Restrictions  ? Other Position/Activity Restrictions None reported by patient   ?  ? Balance Screen  ? Has the patient fallen in the past 6 months Yes   ? How many times? 1   ? Has the patient had a decrease in activity level because of a fear of falling?  Yes   ? Is the patient reluctant  to leave their home because of a fear of falling?  No   ?  ? Home Environment  ? Living Environment Private residence   ?  ? Prior Function  ? Level of Independence Independent   ? Vocation On disability;Full time employment   ? 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  ? Cognition  ? Overall Cognitive Status Within Functional Limits for tasks assessed   ? Attention Focused   ? Focused Attention Appears intact   ? Memory Appears intact   ? Awareness Appears intact   ? Problem Solving Appears intact   ?  ? Observation/Other Assessments  ? Observations Right shoulder is covered and wrapped in a bandage   ?  ? Sensation  ? Additional Comments Patient reports tingling in 1st and 2nd fingers of the right hand   ?  ? ROM / Strength  ? AROM / PROM / Strength AROM;PROM   ?  ? AROM  ? AROM Assessment Site Shoulder   ? Right/Left Shoulder Left   ? Left Shoulder Flexion 140 Degrees   ? Left  Shoulder ABduction 131 Degrees   ? Left Shoulder Internal Rotation --   to T7  ? Left Shoulder External Rotation --   to T4  ?  ? PROM  ? PROM Assessment Site Shoulder   ? Right/Left Shoulder Right   ? Right Shoulder Flexion 90 Degrees   prior to pain  ? Right Shoulder ABduction 55 Degrees   prior to pain  ? Right Shoulder External Rotation 5 Degrees   prior to pain  ?  ? Palpation  ? Palpation comment TTP: right upper trapezius   ? ?  ?  ? ?  ? ? ? ? ? ? ? ? ? ? ? ? ? ?Objective measurements completed on examination: See above findings.  ? ? ? ? ? [facility] Adult PT Treatment/Exercise - 06/03/21 0001   ? ?  ? Exercises  ? Exercises Shoulder   ?  ? Shoulder Exercises: Seated  ? Retraction Both;20 reps   ?  ? Shoulder Exercises: ROM/Strengthening  ? Pendulum AP and lateral; cueing for proper performance   ?  ? Shoulder Exercises: Stretch  ? Other Shoulder Stretches Upper trapezius stretch   4 x 30 seconds  ? ?  ?  ? ?  ? ? ? ? ? ? ? ? ? ? ? ? ? ? ? PT Long Term Goals - 06/03/21 1629   ? ?  ? PT LONG TERM GOAL #1  ? Title Patient will be independent with his HEP.   ? Time 4   ? Period Weeks   ? Status New   ? Target Date 07/01/21   ?  ? PT LONG TERM GOAL #2  ? Title Patient will be able to demonstrate at least 120 degrees of right shoulder flexion for improved function with overhead activities.   ? Time 4   ? Period Weeks   ? Status New   ? Target Date 07/01/21   ?  ? PT LONG TERM GOAL #3  ? Title Patient will be able to lift at least 25 pounds from the floor to his chest for improved function with his critical job demands.   ? Time 4   ? Period Weeks   ? Status New   ? Target Date 07/01/21   ?  ?  PT LONG TERM GOAL #4  ? Title Patient will be able to carry at least 25 pounds at least 50 feet without being limited by his right shoulder for his critical job demands.   ? Time 4   ? Period Weeks   ? Status New   ? Target Date 07/01/21   ? ?  ?  ? ?  ? ? ? ? ? ? ? ? ? Plan - 06/03/21 1435   ? ? Clinical Impression Statement  Patient is a 61 year old male presenting to physical therapy following a right shoulder scope which included a SAD, DCR, manipulation, and biceps tenodesis on 06/02/21. He presented with moderate pain severity and irritability. He exhibited reduced right shoulder PROM with ER being the most limited. His HEP was reviewed and he required minimal cueing with pendulums for proper performance for passive shoulder mobility rather than active ROM. Recommend that he continue with skilled physical therapy to address his remaining impairments to return to his prior level of function.   ? Personal Factors and Comorbidities Other   ? Examination-Activity Limitations Reach Overhead;Carry;Lift;Dressing   ? Examination-Participation Restrictions Occupation;Cleaning;Community Activity;Pincus Badder Work   ? Stability/Clinical Decision Making Evolving/Moderate complexity   ? Clinical Decision Making Moderate   ? Rehab Potential Good   ? PT Frequency 3x / week   ? PT Duration 4 weeks   ? PT Treatment/Interventions ADLs/Self Care Home Management;Cryotherapy;Electrical Stimulation;Moist Heat;Neuromuscular re-education;Therapeutic exercise;Therapeutic activities;Functional mobility training;Patient/family education;Manual techniques;Passive range of motion;Taping;Vasopneumatic Device   ? PT Next Visit Plan PROM, AAROM, and modalities as needed   ? PT Home Exercise Plan Access Code: [email or web address]  URL: [email or web address]  Date: 06/03/2021  Prepared by: Candi Leash    Exercises  - Seated Upper Trapezius Stretch  - 3 x daily - 7 x weekly - 3-4 sets - 30 seconds hold  - Seated Scapular Retraction  - 3 x daily - 7 x weekly - 2 sets - 10 reps   ? Consulted and Agree with Plan of Care Patient   ? ?  ?  ? ?  ? ? ?Patient will benefit from skilled therapeutic intervention in order to improve the following deficits and impairments:  Decreased range of motion, Impaired UE functional use, Decreased activity tolerance, Pain, Hypomobility,  Decreased strength ? ?Visit Diagnosis: ?Stiffness of right shoulder, not elsewhere classified ? ?Acute pain of right shoulder ? ? ? ? ?Problem List ?There are no problems to display for this patient. ? ? ?Olena Leatherwood

## 2021-06-04 ENCOUNTER — Ambulatory Visit: Payer: No Typology Code available for payment source | Admitting: Physical Therapy

## 2021-06-04 ENCOUNTER — Encounter: Payer: Self-pay | Admitting: Physical Therapy

## 2021-06-04 DIAGNOSIS — M25611 Stiffness of right shoulder, not elsewhere classified: Secondary | ICD-10-CM | POA: Diagnosis not present

## 2021-06-04 DIAGNOSIS — M25511 Pain in right shoulder: Secondary | ICD-10-CM

## 2021-06-04 NOTE — Therapy (Addendum)
[facility] [place], [place], [address] Phone: [phone]   Fax:  [phone]  Physical Therapy Treatment  Patient Details  Name: Jamie Russell MRN: [id number] Date of Birth: 1960-02-16 Referring Provider (PT): Doren Custard, [place]   Encounter Date: 06/04/2021   PT End of Session - 06/04/21 1200     Visit Number 2    Number of Visits 12    Date for PT Re-Evaluation 07/04/21    PT Start Time 1033    PT Stop Time 1113    PT Time Calculation (min) 40 min    Activity Tolerance Patient tolerated treatment well    Behavior During Therapy [facility] for tasks assessed/performed             Past Medical History:  Diagnosis Date   Asthma    Hypertension    Renal disorder    kidney stones    Past Surgical History:  Procedure Laterality Date   SHOULDER ARTHROSCOPY WITH ROTATOR CUFF REPAIR Left    URETERAL STENT PLACEMENT Left     There were no vitals filed for this visit.   Subjective Assessment - 06/04/21 1032     Subjective 2-3/10 pain today.    Pertinent History L shoulder surgery (2017)    Limitations Lifting    Patient Stated Goals return to work, using his RUE, reduced pain    Currently in Pain? Yes    Pain Score 3     Pain Location Shoulder    Pain Orientation Right    Pain Descriptors / Indicators Sore;Tightness    Pain Type Surgical pain    Pain Onset In the past 7 days    Pain Frequency Constant                [facility] PT Assessment - 06/04/21 0001       Assessment   Medical Diagnosis Pain in right shoulder    Referring Provider (PT) Doren Custard, PA-C    Onset Date/Surgical Date 06/02/21    Hand Dominance Right    Next MD Visit 06/17/21    Prior Therapy Yes, for right wrist      Precautions   Precautions Shoulder    Type of Shoulder Precautions No precautions reported by patient                           [facility] Adult PT Treatment/Exercise - 06/04/21 0001       Modalities    Modalities Vasopneumatic      Vasopneumatic   Number Minutes Vasopneumatic  10 minutes    Vasopnuematic Location  Shoulder    Vasopneumatic Pressure Low    Vasopneumatic Temperature  36      Manual Therapy   Manual Therapy Passive ROM    Passive ROM PROM of R shoulder into flex, ER, IR with holds at end range                          PT Long Term Goals - 06/03/21 1629       PT LONG TERM GOAL #1   Title Patient will be independent with his HEP.    Time 4    Period Weeks    Status New    Target Date 07/01/21      PT LONG TERM GOAL #2   Title Patient will be able to demonstrate at least 120 degrees of right shoulder flexion for improved  function with overhead activities.    Time 4    Period Weeks    Status New    Target Date 07/01/21      PT LONG TERM GOAL #3   Title Patient will be able to lift at least 25 pounds from the floor to his chest for improved function with his critical job demands.    Time 4    Period Weeks    Status New    Target Date 07/01/21      PT LONG TERM GOAL #4   Title Patient will be able to carry at least 25 pounds at least 50 feet without being limited by his right shoulder for his critical job demands.    Time 4    Period Weeks    Status New    Target Date 07/01/21                   Plan - 06/04/21 1200     Clinical Impression Statement Patient presented in clinic with mild R shoulder pain in sling. Patient still fairly guarded as he was only two days post op. Patient required oscillations and gentle PROM during session due to guarding. Greater limitation and discomfort reported by patient with ER. Firm end feels and smooth arc of motion noted during PROM. Normal vasopnuematic response noted following removal of the modality.    Personal Factors and Comorbidities Other    Examination-Activity Limitations Reach Overhead;Carry;Lift;Dressing    Examination-Participation Restrictions Occupation;Cleaning;Community  Activity;Yard Work    [employer] Evolving/Moderate complexity    Rehab Potential Good    PT Frequency 3x / week    PT Duration 4 weeks    PT Treatment/Interventions ADLs/Self Care Home Management;Cryotherapy;Electrical Stimulation;Moist Heat;Neuromuscular re-education;Therapeutic exercise;Therapeutic activities;Functional mobility training;Patient/family education;Manual techniques;Passive range of motion;Taping;Vasopneumatic Device    PT Next Visit Plan PROM, AAROM, and modalities as needed    PT Home Exercise Plan Access Code: [email or web address]  URL: [email or web address]  Date: 06/03/2021  Prepared by: Jacqulynn Cadet    Exercises  - Seated Upper Trapezius Stretch  - 3 x daily - 7 x weekly - 3-4 sets - 30 seconds hold  - Seated Scapular Retraction  - 3 x daily - 7 x weekly - 2 sets - 10 reps    Consulted and Agree with Plan of Care Patient             Patient will benefit from skilled therapeutic intervention in order to improve the following deficits and impairments:  Decreased range of motion, Impaired UE functional use, Decreased activity tolerance, Pain, Hypomobility, Decreased strength  Visit Diagnosis: Stiffness of right shoulder, not elsewhere classified  Acute pain of right shoulder     Problem List There are no problems to display for this patient.   Standley Brooking, PTA 06/04/2021, 12:06 PM  [facility] [address] [place], [place], [address] Phone: [phone]   Fax:  [phone]  Name: Jamie Russell MRN: [id number] Date of Birth: Oct 01, 1960  PHYSICAL THERAPY DISCHARGE SUMMARY  Visits from Start of Care: 2  Current functional level related to goals / functional outcomes: Patient failed to complete their plan of care and is being discharged at this time.    Remaining deficits: No significant change from his initial evaluation    Education / Equipment: HEP   Patient agrees to  discharge. Patient goals were not met. Patient is being discharged due to not returning since the last visit.  Jacqulynn Cadet, PT, DPT

## 2021-06-09 ENCOUNTER — Ambulatory Visit: Payer: No Typology Code available for payment source | Admitting: Physical Therapy

## 2021-06-13 ENCOUNTER — Encounter: Payer: Self-pay | Admitting: Physical Therapy

## 2021-07-28 ENCOUNTER — Other Ambulatory Visit (HOSPITAL_COMMUNITY): Payer: Self-pay | Admitting: Family Medicine

## 2021-07-28 ENCOUNTER — Ambulatory Visit (HOSPITAL_COMMUNITY)
Admission: RE | Admit: 2021-07-28 | Discharge: 2021-07-28 | Disposition: A | Discharge status: Home / Self Care | Payer: BC Managed Care – PPO | Source: Ambulatory Visit | Attending: Family Medicine | Admitting: Family Medicine

## 2021-07-28 DIAGNOSIS — J45909 Unspecified asthma, uncomplicated: Secondary | ICD-10-CM | POA: Insufficient documentation

## 2021-08-04 ENCOUNTER — Other Ambulatory Visit: Payer: Self-pay | Admitting: Family Medicine

## 2021-08-04 ENCOUNTER — Other Ambulatory Visit (HOSPITAL_COMMUNITY): Payer: Self-pay | Admitting: Family Medicine

## 2021-08-04 DIAGNOSIS — R9389 Abnormal findings on diagnostic imaging of other specified body structures: Secondary | ICD-10-CM

## 2021-08-21 ENCOUNTER — Ambulatory Visit (HOSPITAL_COMMUNITY)
Admission: RE | Admit: 2021-08-21 | Discharge: 2021-08-21 | Disposition: A | Discharge status: Home / Self Care | Payer: BC Managed Care – PPO | Source: Ambulatory Visit | Attending: Family Medicine | Admitting: Family Medicine

## 2021-08-21 DIAGNOSIS — R9389 Abnormal findings on diagnostic imaging of other specified body structures: Secondary | ICD-10-CM | POA: Diagnosis present

## 2021-08-22 ENCOUNTER — Other Ambulatory Visit: Payer: Self-pay

## 2021-08-22 DIAGNOSIS — I7121 Aneurysm of the ascending aorta, without rupture: Secondary | ICD-10-CM

## 2021-08-22 DIAGNOSIS — I712 Thoracic aortic aneurysm, without rupture, unspecified: Secondary | ICD-10-CM | POA: Insufficient documentation

## 2021-10-02 NOTE — Progress Notes (Unsigned)
[address]       [name] [address]             [phone]        Jamie Russell [id number] Feb 02, 1960   History of Present Illness:  Mr. Obara is a 61 year old male with a past medical history of hypertension, asthma and kidney stones. On 06/02/2021 he had a rotator cuff repair surgery and on 06/16/21 presented to an urgent care with chest pain, fevers, and shortness of breath. He was found to have left sided consolidation and a small pleural effusion, he was treated with doxycycline and cephalexin for pneumonia and had a follow up CXR two months later which showed a new opacity in the lingula. A follow up chest CT was ordered which confirmed lingula atelectasis but also incidentally found a 4.2cm ascending thoracic aneurysm as well as coronary artery calcifications. He was referred to cardiothoracic surgery by Dr. Phillips Odor. He presents to the office today and does/does not *** have chest pain, chest tightness, shortness of breath, dizziness, syncope.    Past Medical History:  Diagnosis Date   Asthma    Hypertension    Renal disorder    kidney stones    Current Outpatient Medications on File Prior to Visit  Medication Sig Dispense Refill   albuterol (VENTOLIN HFA) 108 (90 Base) MCG/ACT inhaler INHALE 1 TO 2 PUFFS EVERY 4 HOURS AS NEEDED     ciprofloxacin (CIPRO) 500 MG tablet Take 1 tablet (500 mg total) by mouth 2 (two) times daily. (Patient not taking: Reported on 06/03/2021) 20 tablet 0   HYDROcodone-acetaminophen (NORCO/VICODIN) 5-325 MG tablet Take 1 tablet by mouth every 6 (six) hours as needed for moderate pain or severe pain. (Patient not taking: Reported on 06/03/2021) 15 tablet 0   metroNIDAZOLE (FLAGYL) 500 MG tablet Take 1 tablet (500 mg total) by mouth 3 (three) times daily. 30 tablet 0   montelukast (SINGULAIR) 10 MG tablet Take 10 mg by mouth daily.     ondansetron (ZOFRAN) 4 MG tablet Take 1 tablet (4 mg total) by mouth every 8 (eight) hours as  needed for nausea or vomiting. 6 tablet 0   No current facility-administered medications on file prior to visit.   Vitals  ROS   Physical Exam  General Neuro CV Pulm GI Extremities   CT Results:  CLINICAL DATA:  Follow-up x-ray.   EXAM: CT CHEST WITHOUT CONTRAST   TECHNIQUE: Multidetector CT imaging of the chest was performed following the standard protocol without IV contrast.   RADIATION DOSE REDUCTION: This exam was performed according to the departmental dose-optimization program which includes automated exposure control, adjustment of the mA and/or kV according to patient size and/or use of iterative reconstruction technique.   COMPARISON:  Chest x-ray 07/28/2021   FINDINGS: Cardiovascular: There is mild aneurysmal dilatation of the ascending aorta measuring 4.2 cm. The heart is normal in size. There are coronary artery calcifications present.   Mediastinum/Nodes: No enlarged mediastinal or axillary lymph nodes. Thyroid gland, trachea, and esophagus demonstrate no significant findings.   Lungs: There is a band of opacity in the lingula favored as atelectasis. No air bronchograms. No discrete pulmonary nodule. There is a calcified granuloma in the right lung. The lungs are otherwise clear. No pleural effusion or pneumothorax. Trachea and central airways are patent.   Upper Abdomen: No acute abnormality.   Musculoskeletal: No chest wall mass or suspicious bone lesions identified.   IMPRESSION: 1. There  is a band of opacity in the lingula favored is atelectasis or scarring. Consider follow-up chest CT in 3 months to re-evaluate. 2. 4.2 cm ascending aortic aneurysm. Recommend semi-annual imaging followup by CTA or MRA and referral to cardiothoracic surgery if not already obtained. This recommendation follows 2010 ACCF/AHA/AATS/ACR/ASA/SCA/SCAI/SIR/STS/SVM Guidelines for the Diagnosis and Management of Patients With Thoracic Aortic  Disease. Circulation. 2010; 121: E268-T41. Aortic aneurysm NOS (ICD10-I71.9)     Electronically Signed   By: Darliss Cheney M.D.   On: 08/21/2021 20:09   A/P:  Ascending thoracic aneurysm:    Risk Modification in those with ascending thoracic aortic aneurysm:  Continue good control of blood pressure (prefer SBP 130/80 or less) - ***  2. Avoid fluoroquinolone antibiotics (I.e Ciprofloxacin, Avelox, Levofloxacin, Ofloxacin)  3.  Use of statin (to decrease cardiovascular risk) - ***  4.  Exercise and activity limitations is individualized, but in general, contact sports are to be  avoided and one should avoid heavy lifting (defined as half of ideal body weight) and exercises involving sustained Valsalva maneuver.  5. Counseling for those suspected of having genetically mediated disease. First-degree relatives of those with TAA disease should be screened as well as those who have a connective tissue disease (I.e with Marfan syndrome, Ehlers-Danlos syndrome,  and Loeys-Dietz syndrome) or a  bicuspid aortic valve,have an increased risk for  complications related to TAA  6. If one has tobacco abuse, smoking cessation is highly encouraged. He has a history of tobacco abuse he is/is not*** currently a smoker.  7. Patient educated on signs and symptoms of Aortic Dissection, handout also provided in AVS  Jenny Reichmann, PA-C 10/02/21

## 2021-10-06 ENCOUNTER — Institutional Professional Consult (permissible substitution) (INDEPENDENT_AMBULATORY_CARE_PROVIDER_SITE_OTHER): Payer: BC Managed Care – PPO | Admitting: Physician Assistant

## 2021-10-06 VITALS — BP 158/80 | HR 64 | Resp 20 | Wt 217.1 lb

## 2021-10-06 DIAGNOSIS — I7121 Aneurysm of the ascending aorta, without rupture: Secondary | ICD-10-CM | POA: Diagnosis not present

## 2021-10-06 NOTE — Patient Instructions (Addendum)
Continue good control of blood pressure (prefer SBP 130/80 or less) - 158/80 today  2. Avoid fluoroquinolone antibiotics (I.e Ciprofloxacin, Avelox, Levofloxacin, Ofloxacin)  3.  Use of statin (to decrease cardiovascular risk) - followed by PCP  4.  Exercise and activity limitations is individualized, but in general, contact sports are to be  avoided and one should avoid heavy lifting (defined as half of ideal body weight) and exercises involving sustained Valsalva maneuver.  5. Counseling for those suspected of having genetically mediated disease. First-degree relatives of those with TAA disease should be screened as well as those who have a connective tissue disease (I.e with Marfan syndrome, Ehlers-Danlos syndrome,  and Loeys-Dietz syndrome) or a  bicuspid aortic valve,have an increased risk for  complications related to TAA  6. If one has tobacco abuse, smoking cessation is highly encouraged. He has a history of tobacco abuse, he is not currently a smoker.  7. Patient educated on signs and symptoms of Aortic Dissection, handout also provided in AVS

## 2021-11-21 ENCOUNTER — Other Ambulatory Visit (HOSPITAL_COMMUNITY): Payer: Self-pay | Admitting: Family Medicine

## 2021-11-21 DIAGNOSIS — R9389 Abnormal findings on diagnostic imaging of other specified body structures: Secondary | ICD-10-CM

## 2022-01-02 ENCOUNTER — Ambulatory Visit (HOSPITAL_COMMUNITY)
Admission: RE | Admit: 2022-01-02 | Discharge: 2022-01-02 | Disposition: A | Discharge status: Home / Self Care | Payer: BC Managed Care – PPO | Source: Ambulatory Visit | Attending: Family Medicine | Admitting: Family Medicine

## 2022-01-02 DIAGNOSIS — R9389 Abnormal findings on diagnostic imaging of other specified body structures: Secondary | ICD-10-CM | POA: Insufficient documentation

## 2022-09-04 ENCOUNTER — Other Ambulatory Visit: Payer: Self-pay | Admitting: Thoracic Surgery (Cardiothoracic Vascular Surgery)

## 2022-09-04 DIAGNOSIS — I7121 Aneurysm of the ascending aorta, without rupture: Secondary | ICD-10-CM

## 2022-10-06 ENCOUNTER — Ambulatory Visit
Admission: RE | Admit: 2022-10-06 | Discharge: 2022-10-06 | Disposition: A | Discharge status: Home / Self Care | Payer: BC Managed Care – PPO | Source: Ambulatory Visit | Attending: Thoracic Surgery (Cardiothoracic Vascular Surgery) | Admitting: Thoracic Surgery (Cardiothoracic Vascular Surgery)

## 2022-10-06 ENCOUNTER — Ambulatory Visit (INDEPENDENT_AMBULATORY_CARE_PROVIDER_SITE_OTHER): Payer: BC Managed Care – PPO | Admitting: Physician Assistant

## 2022-10-06 VITALS — BP 160/74 | HR 59 | Resp 20 | Wt 199.8 lb

## 2022-10-06 DIAGNOSIS — I7121 Aneurysm of the ascending aorta, without rupture: Secondary | ICD-10-CM

## 2022-10-06 MED ORDER — IOPAMIDOL (ISOVUE-370) INJECTION 76%
500.0000 mL | Freq: Once | INTRAVENOUS | Status: AC | PRN
  Administered 2022-10-06: 75 mL via INTRAVENOUS

## 2022-10-06 NOTE — Patient Instructions (Signed)
Risk Modification in those with ascending thoracic aortic aneurysm:  Continue good control of blood pressure (prefer SBP 130/80 or less)   2. Avoid fluoroquinolone antibiotics (I.e Ciprofloxacin, Avelox, Levofloxacin, Ofloxacin)  3. Recommend use of a statin medication (to decrease cardiovascular risk).   4.  In general, contact sports are to be avoided and one should avoid heavy lifting (defined as half of ideal body weight). Also avoid exercises involving sustained Valsalva maneuver.  5. Follow up in 1 year with CTA Chest.

## 2022-10-06 NOTE — Progress Notes (Signed)
[address]       Jamie Russell [address]             [phone]     HPI: Jamie Russell is a 62 year old male with a past medical history of hypertension, asthma, kidney stones and who is s/p rotator cuff surgery last year.  Following that procedure, he had a left-side pneumonia with an associated pleural effusion.  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 He was referred to [facility] for evaluation and surveillance. He saw Dr. Phillips Odor in December 2023 with a CT chest to follow up on the lingular opacity.  At that time, the ascending aneurysm was stable at 4.2cm. The lingular opacity was felt to represent scarring.  Jamie Russell returns today for scheduled follow up after CTA chest earlier today.  Since being seen one year ago he reports no change in his health.  He denies any chest pain or syncope. He is planning to retire within the next few weeks.    Current Outpatient Medications  Medication Sig Dispense Refill   albuterol (VENTOLIN HFA) 108 (90 Base) MCG/ACT inhaler INHALE 1 TO 2 PUFFS EVERY 4 HOURS AS NEEDED     montelukast (SINGULAIR) 10 MG tablet Take 10 mg by mouth daily.     No current facility-administered medications for this visit.    Physical Exam VS BP 160/74 HR 59 RR 20 SpO2 95%  General: Well-developed 62 year old male in no distress. Heart: Regular rate and rhythm, no murmur Chest: Symmetrical, breath sounds are clear to auscultation Extremities: Peripheral pulses intact, no edema  Diagnostic Tests: CLINICAL DATA:  Evaluate for thoracic aortic aneurysm.   EXAM: CT ANGIOGRAPHY CHEST WITH CONTRAST   TECHNIQUE: Multidetector CT imaging of the chest was performed using the standard protocol during bolus administration of intravenous contrast. Multiplanar CT image reconstructions and MIPs were obtained to evaluate  the vascular anatomy.   RADIATION DOSE REDUCTION: This exam was performed according to the departmental dose-optimization program which includes automated exposure control, adjustment of the mA and/or kV according to patient size and/or use of iterative reconstruction technique.   CONTRAST:  75mL ISOVUE-370 IOPAMIDOL (ISOVUE-370) INJECTION 76%   COMPARISON:  Chest CT-01/02/2022; 08/21/2021   FINDINGS: Vascular Findings:   Stable uncomplicated mild fusiform aneurysmal dilatation of the ascending thoracic aorta with measurements as follows. The thoracic aorta tapers to a normal caliber at the level of the aortic arch. The aortic arch and descending thoracic aorta are of normal caliber and widely patent without a hemodynamically significant narrowing. No evidence of thoracic aortic dissection or perivascular stranding.   Cardiomegaly. No pericardial effusion though small amount of fluid is seen with the pericardial recess. Coronary artery calcifications.   Although this examination was not tailored for the evaluation the pulmonary arteries, there are no discrete filling defects within the central pulmonary arterial tree to suggest central pulmonary embolism. Normal caliber of the main pulmonary artery.   -------------------------------------------------------------   Thoracic aortic measurements:   SINOTUBULAR JUNCTION: 31 mm as measured in greatest oblique short axis coronal dimension.   PROXIMAL ASCENDING THORACIC AORTA: 40 mm as measured in greatest oblique short axis axial dimension (axial image 71, series 5) at the level of the main pulmonary artery and approximately 41 mm as measured in greatest oblique short axis coronal dimension (coronal image 65, series 10), unchanged  compared to the 08/2021 examination   AORTIC ARCH: 27 mm as measured in greatest oblique short axis sagittal dimension.   PROXIMAL DESCENDING THORACIC AORTA: 26 mm as measured in greatest oblique  short axis axial dimension at the level of the main pulmonary artery.   DISTAL DESCENDING THORACIC AORTA: 25 mm as measured in greatest oblique short axis axial dimension at the level of the diaphragmatic hiatus.   Review of the MIP images confirms the above findings.   -------------------------------------------------------------   Non-Vascular Findings:   Mediastinum/Lymph Nodes: No bulky mediastinal, hilar or axillary lymphadenopathy.   Lungs/Pleura: Redemonstrated lingular atelectasis/scarring, similar to the 08/2021 examination. There is a unchanged punctate (2 mm) granuloma within the right upper lobe. No discrete worrisome pulmonary nodules. No focal airspace opacities. No pleural effusion or pneumothorax. The central pulmonary airways appear widely patent.   Upper abdomen: Limited postcontrast evaluation of the upper abdomen is unremarkable.   Musculoskeletal: No acute or aggressive osseous abnormalities. Stigmata of dish within the thoracic spine. Regional soft tissues appear normal. Normal appearance of the thyroid gland.   IMPRESSION: 1. Stable uncomplicated fusiform aneurysmal dilatation of the ascending thoracic aorta measuring 41 mm in diameter, unchanged compared to the 08/2021 examination. Recommend annual imaging followup by CTA or MRA. This recommendation follows 2010 ACCF/AHA/AATS/ACR/ASA/SCA/SCAI/SIR/STS/SVM Guidelines for the Diagnosis and Management of Patients with Thoracic Aortic Disease. Circulation. 2010; 121: V956-L875. Aortic aneurysm NOS (ICD10-I71.9) 2. Coronary artery calcifications. Aortic Atherosclerosis (ICD10-I70.0). 3. Grossly unchanged lingular atelectasis/scarring, similar to the 08/2021 examination.     Electronically Signed   By: Simonne Come M.D.   On: 10/06/2022 15:59    Impression / Plan: Very mild aneurysmal dilation of the ascending thoracic aorta on his CTA which is the first contrast imaging he has had of his aorta. We  discussed the importance of long-term surveillance and careful blood pressure control.  We discussed the recommendation of avoiding strenuous activity involving prolonged Valsalva maneuver lifting greater than 50% of body weight.  Jamie Russell is hypertensive today and he thinks this is related to stress related to his job but he informed me plans to retire within the next week.  He also assures me that he monitors his blood pressure periodically at home and it is rarely above 130s.  I asked him to keep a diary of his blood pressure over the next several weeks to share with his PCP when he presents for his annual physical in November.    Follow-up with [place] in 1 year with CTA chest.  Leary Roca, PA-C [facility] [phone]

## 2022-10-08 ENCOUNTER — Other Ambulatory Visit: Payer: BC Managed Care – PPO

## 2022-10-08 ENCOUNTER — Ambulatory Visit: Payer: BC Managed Care – PPO

## 2023-01-18 ENCOUNTER — Encounter (INDEPENDENT_AMBULATORY_CARE_PROVIDER_SITE_OTHER): Payer: Self-pay | Admitting: *Deleted

## 2023-02-18 ENCOUNTER — Telehealth (INDEPENDENT_AMBULATORY_CARE_PROVIDER_SITE_OTHER): Payer: Self-pay | Admitting: Gastroenterology

## 2023-02-18 NOTE — Telephone Encounter (Signed)
Who is your primary care physician: Assunta Found  Reasons for the colonoscopy: Screening  Have you had a colonoscopy before?  no  Do you have family history of colon cancer? no  Previous colonoscopy with polyps removed? no  Do you have a history colorectal cancer?   no  Are you diabetic? If yes, Type 1 or Type 2?    no  Do you have a prosthetic or mechanical heart valve? no  Do you have a pacemaker/defibrillator?   no  Have you had endocarditis/atrial fibrillation? no  Have you had joint replacement within the last 12 months?  no  Do you tend to be constipated or have to use laxatives? no  Do you have any history of drugs or alchohol?  no  Do you use supplemental oxygen?  no  Have you had a stroke or heart attack within the last 6 months? no  Do you take weight loss medication?  no  Do you take any blood-thinning medications such as: (aspirin, warfarin, Plavix, Aggrenox)  no  If yes we need the name, milligram, dosage and who is prescribing doctor  Current Outpatient Medications on File Prior to Visit  Medication Sig Dispense Refill   albuterol (VENTOLIN HFA) 108 (90 Base) MCG/ACT inhaler INHALE 1 TO 2 PUFFS EVERY 4 HOURS AS NEEDED     montelukast (SINGULAIR) 10 MG tablet Take 10 mg by mouth daily.     No current facility-administered medications on file prior to visit.    No Known Allergies   Pharmacy: [facility]  Primary Insurance Name: Ezequiel Essex  Best number where you can be reached: [phone]

## 2023-02-18 NOTE — Telephone Encounter (Signed)
Ok to schedule.  Room 1/2  Thanks,  Vista Lawman, MD Gastroenterology and Hepatology [facility]

## 2023-02-22 MED ORDER — PEG 3350-KCL-NA BICARB-NACL 420 G PO SOLR: 4000.0000 mL | Freq: Once | ORAL | 0 refills | Status: AC

## 2023-02-22 NOTE — Telephone Encounter (Signed)
Pt returned call. Pt scheduled for 03/04/23. Instructions will be mailed to pt. Prep sent to pharmacy.

## 2023-02-22 NOTE — Addendum Note (Signed)
Addended by: Marlowe Shores on: 02/22/2023 04:23 PM   Modules accepted: Orders

## 2023-02-22 NOTE — Telephone Encounter (Signed)
 Left message to return call

## 2023-02-23 ENCOUNTER — Encounter (INDEPENDENT_AMBULATORY_CARE_PROVIDER_SITE_OTHER): Payer: Self-pay | Admitting: *Deleted

## 2023-02-23 NOTE — Telephone Encounter (Signed)
 Referral completed, [facility] apt letter sent to PCP

## 2023-03-01 ENCOUNTER — Encounter (HOSPITAL_COMMUNITY): Payer: Self-pay

## 2023-03-01 ENCOUNTER — Encounter (HOSPITAL_COMMUNITY)
Admission: RE | Admit: 2023-03-01 | Discharge: 2023-03-01 | Disposition: A | Discharge status: Home / Self Care | Payer: BC Managed Care – PPO | Source: Ambulatory Visit | Attending: Gastroenterology | Admitting: Gastroenterology

## 2023-03-01 HISTORY — DX: Family history of other specified conditions: Z84.89

## 2023-03-04 ENCOUNTER — Encounter (HOSPITAL_COMMUNITY)
Admission: RE | Disposition: A | Discharge status: Home / Self Care | Payer: Self-pay | Source: Home / Self Care | Attending: Gastroenterology

## 2023-03-04 ENCOUNTER — Other Ambulatory Visit: Payer: Self-pay

## 2023-03-04 ENCOUNTER — Encounter (HOSPITAL_COMMUNITY): Payer: Self-pay | Admitting: Gastroenterology

## 2023-03-04 ENCOUNTER — Ambulatory Visit (HOSPITAL_COMMUNITY)
Admission: RE | Admit: 2023-03-04 | Discharge: 2023-03-04 | Disposition: A | Discharge status: Home / Self Care | Payer: BC Managed Care – PPO | Attending: Gastroenterology | Admitting: Gastroenterology

## 2023-03-04 ENCOUNTER — Ambulatory Visit (HOSPITAL_COMMUNITY): Payer: Self-pay | Admitting: Anesthesiology

## 2023-03-04 ENCOUNTER — Encounter (INDEPENDENT_AMBULATORY_CARE_PROVIDER_SITE_OTHER): Payer: Self-pay | Admitting: *Deleted

## 2023-03-04 DIAGNOSIS — K648 Other hemorrhoids: Secondary | ICD-10-CM | POA: Diagnosis not present

## 2023-03-04 DIAGNOSIS — J45909 Unspecified asthma, uncomplicated: Secondary | ICD-10-CM | POA: Diagnosis not present

## 2023-03-04 DIAGNOSIS — K5730 Diverticulosis of large intestine without perforation or abscess without bleeding: Secondary | ICD-10-CM

## 2023-03-04 DIAGNOSIS — Z800 Family history of malignant neoplasm of digestive organs: Secondary | ICD-10-CM

## 2023-03-04 DIAGNOSIS — K644 Residual hemorrhoidal skin tags: Secondary | ICD-10-CM | POA: Diagnosis not present

## 2023-03-04 DIAGNOSIS — Z1211 Encounter for screening for malignant neoplasm of colon: Secondary | ICD-10-CM | POA: Insufficient documentation

## 2023-03-04 DIAGNOSIS — I10 Essential (primary) hypertension: Secondary | ICD-10-CM | POA: Diagnosis not present

## 2023-03-04 HISTORY — PX: COLONOSCOPY WITH PROPOFOL: SHX5780

## 2023-03-04 LAB — HM COLONOSCOPY

## 2023-03-04 SURGERY — COLONOSCOPY WITH PROPOFOL
Anesthesia: General

## 2023-03-04 MED ORDER — PROPOFOL 500 MG/50ML IV EMUL
INTRAVENOUS | Status: DC | PRN
  Administered 2023-03-04: 175 ug/kg/min via INTRAVENOUS

## 2023-03-04 MED ORDER — PROPOFOL 10 MG/ML IV BOLUS
INTRAVENOUS | Status: DC | PRN
  Administered 2023-03-04: 60 mg via INTRAVENOUS

## 2023-03-04 MED ORDER — LACTATED RINGERS IV SOLN: INTRAVENOUS | Status: DC | PRN

## 2023-03-04 MED ORDER — LIDOCAINE HCL (CARDIAC) PF 100 MG/5ML IV SOSY
PREFILLED_SYRINGE | INTRAVENOUS | Status: DC | PRN
  Administered 2023-03-04: 50 mg via INTRAVENOUS

## 2023-03-04 MED ORDER — LACTATED RINGERS IV SOLN: INTRAVENOUS | Status: DC

## 2023-03-04 MED ORDER — PROPOFOL 10 MG/ML IV BOLUS: INTRAVENOUS | Status: DC | PRN

## 2023-03-04 NOTE — Anesthesia Postprocedure Evaluation (Signed)
Anesthesia Post Note  Patient: Jamie Russell  Procedure(s) Performed: COLONOSCOPY WITH PROPOFOL  Patient location during evaluation: PACU Anesthesia Type: General Level of consciousness: awake and alert Pain management: pain level controlled Vital Signs Assessment: post-procedure vital signs reviewed and stable Respiratory status: spontaneous breathing, nonlabored ventilation and respiratory function stable Cardiovascular status: blood pressure returned to baseline and stable Postop Assessment: no apparent nausea or vomiting Anesthetic complications: no   No notable events documented.   Last Vitals:  Vitals:   03/04/23 0802 03/04/23 0851  BP: 101/74 (!) 92/53  Pulse: 67 61  Resp: 16 15  Temp: 37 C 36.6 C  SpO2: 98% 95%    Last Pain:  Vitals:   03/04/23 0851  TempSrc: Oral  PainSc: 0-No pain                 Roslynn Amble

## 2023-03-04 NOTE — Op Note (Signed)
[facility] Patient Name: Jamie Russell Procedure Date: 03/04/2023 8:19 AM MRN: [id number] Date of Birth: 07-23-1960 Attending MD: Sanjuan Dame , MD, [id number] CSN: [id number] Age: 63 Admit Type: Outpatient Procedure:                Colonoscopy Indications:              Screening patient at increased risk: Family history                            of colorectal cancer in multiple 1st-degree                            relatives, Colon cancer screening in patient at                            increased risk: Colorectal cancer in father Providers:                Sanjuan Dame, MD, Edrick Kins, RN, Lennice Sites                            Technician, Technician Referring MD:              Medicines:                Monitored Anesthesia Care Complications:            No immediate complications. Estimated Blood Loss:     Estimated blood loss: none. Procedure:                Pre-Anesthesia Assessment:                           - Prior to the procedure, a History and Physical                            was performed, and patient medications and                            allergies were reviewed. The patient's tolerance of                            previous anesthesia was also reviewed. The risks                            and benefits of the procedure and the sedation                            options and risks were discussed with the patient.                            All questions were answered, and informed consent                            was obtained. Prior Anticoagulants: The patient has  taken no anticoagulant or antiplatelet agents. ASA                            Grade Assessment: II - A patient with mild systemic                            disease. After reviewing the risks and benefits,                            the patient was deemed in satisfactory condition to                            undergo the procedure.                           After  obtaining informed consent, the colonoscope                            was passed under direct vision. Throughout the                            procedure, the patient's blood pressure, pulse, and                            oxygen saturations were monitored continuously. The                            [id number] scope was introduced through                            the anus and advanced to the the cecum, identified                            by appendiceal orifice and ileocecal valve. The                            colonoscopy was performed without difficulty. The                            patient tolerated the procedure well. The quality                            of the bowel preparation was evaluated using the                            BBPS [facility] Bowel Preparation Scale) with scores                            of: Right Colon = 3, Transverse Colon = 3 and Left                            Colon = 3 (entire mucosa seen well with no residual  staining, small fragments of stool or opaque                            liquid). The total BBPS score equals 9. The                            ileocecal valve, appendiceal orifice, and rectum                            were photographed. Scope In: 8:32:47 AM Scope Out: 8:47:08 AM Scope Withdrawal Time: 0 hours 12 minutes 26 seconds  Total Procedure Duration: 0 hours 14 minutes 21 seconds  Findings:      The perianal and digital rectal examinations were normal.      Scattered diverticula were found in the left colon.      Non-bleeding external and internal hemorrhoids were found during       retroflexion. The hemorrhoids were small. Impression:               - Diverticulosis in the left colon.                           - Non-bleeding external and internal hemorrhoids.                           - No specimens collected. Moderate Sedation:      Per Anesthesia Care Recommendation:           - Patient has a  contact number available for                            emergencies. The signs and symptoms of potential                            delayed complications were discussed with the                            patient. Return to normal activities tomorrow.                            Written discharge instructions were provided to the                            patient.                           - Resume previous diet.                           - Continue present medications.                           - Repeat colonoscopy in 5 years for screening                            purposes.                           -  Return to primary care physician as previously                            scheduled. Procedure Code(s):        --- Professional ---                           R4270, Colorectal cancer screening; colonoscopy on                            individual at high risk Diagnosis Code(s):        --- Professional ---                           K64.8, Other hemorrhoids                           Z80.0, Family history of malignant neoplasm of                            digestive organs                           K57.30, Diverticulosis of large intestine without                            perforation or abscess without bleeding CPT copyright 2022 American Medical Association. All rights reserved. The codes documented in this report are preliminary and upon coder review may  be revised to meet current compliance requirements. Sanjuan Dame, MD Sanjuan Dame, MD 03/04/2023 8:56:33 AM This report has been signed electronically. Number of Addenda: 0

## 2023-03-04 NOTE — Anesthesia Preprocedure Evaluation (Signed)
Anesthesia Evaluation  Patient identified by MRN, date of birth, ID band Patient awake    Reviewed: Allergy & Precautions, H&P , NPO status , Patient's Chart, lab work & pertinent test results  History of Anesthesia Complications (+) Family history of anesthesia reaction  Airway Mallampati: II  TM Distance: >3 FB Neck ROM: Full    Dental no notable dental hx.    Pulmonary neg pulmonary ROS, asthma    Pulmonary exam normal breath sounds clear to auscultation       Cardiovascular hypertension, negative cardio ROS Normal cardiovascular exam Rhythm:Regular Rate:Normal     Neuro/Psych negative neurological ROS  negative psych ROS   GI/Hepatic negative GI ROS, Neg liver ROS,,,  Endo/Other  negative endocrine ROS    Renal/GU Renal diseasenegative Renal ROS  negative genitourinary   Musculoskeletal negative musculoskeletal ROS (+)    Abdominal   Peds negative pediatric ROS (+)  Hematology negative hematology ROS (+)   Anesthesia Other Findings   Reproductive/Obstetrics negative OB ROS                             Anesthesia Physical Anesthesia Plan  ASA: 2  Anesthesia Plan: General   Post-op Pain Management: Minimal or no pain anticipated   Induction: Intravenous  PONV Risk Score and Plan: 1 and Propofol infusion  Airway Management Planned: Simple Face Mask and Nasal Cannula  Additional Equipment:   Intra-op Plan:   Post-operative Plan:   Informed Consent: I have reviewed the patients History and Physical, chart, labs and discussed the procedure including the risks, benefits and alternatives for the proposed anesthesia with the patient or authorized representative who has indicated his/her understanding and acceptance.       Plan Discussed with: CRNA  Anesthesia Plan Comments:        Anesthesia Quick Evaluation

## 2023-03-04 NOTE — Discharge Instructions (Addendum)

## 2023-03-04 NOTE — Transfer of Care (Signed)
Immediate Anesthesia Transfer of Care Note  Patient: Jamie Russell  Procedure(s) Performed: COLONOSCOPY WITH PROPOFOL  Patient Location: PACU  Anesthesia Type:General  Level of Consciousness: sedated  Airway & Oxygen Therapy: Patient Spontanous Breathing  Post-op Assessment: Report given to RN and Post -op Vital signs reviewed and stable  Post vital signs: Reviewed and stable  Last Vitals:  Vitals Value Taken Time  BP 92/53 03/04/23 0851  Temp    Pulse 66 03/04/23 0853  Resp 19 03/04/23 0853  SpO2 96 % 03/04/23 0853  Vitals shown include unfiled device data.  Last Pain:  Vitals:   03/04/23 0826  TempSrc:   PainSc: 0-No pain         Complications: No notable events documented.

## 2023-03-04 NOTE — H&P (Addendum)
Primary Care Physician:  Assunta Found, MD Primary Gastroenterologist:  Dr. Tasia Catchings  Pre-Procedure History & Physical: HPI:  Jamie Russell is a 63 y.o. male is here for a colonoscopy for colon cancer screening purposes.  Reports Father had colorectal cancer.  No melena or hematochezia.  No abdominal pain or unintentional weight loss.  No change in bowel habits.  Overall feels well from a GI standpoint.  Past Medical History:  Diagnosis Date   Asthma    Family history of adverse reaction to anesthesia    Hypertension    Renal disorder    kidney stones    Past Surgical History:  Procedure Laterality Date   SHOULDER ARTHROSCOPY WITH ROTATOR CUFF REPAIR Left    URETERAL STENT PLACEMENT Left     Prior to Admission medications   Medication Sig Start Date End Date Taking? Authorizing Provider  albuterol (VENTOLIN HFA) 108 (90 Base) MCG/ACT inhaler INHALE 1 TO 2 PUFFS EVERY 4 HOURS AS NEEDED 09/20/20   [provider]  montelukast (SINGULAIR) 10 MG tablet Take 10 mg by mouth daily. 07/09/21   [provider]    Allergies as of 02/22/2023   (No Known Allergies)    History reviewed. No pertinent family history.  Social History   Socioeconomic History   Marital status: Married    Spouse name: Not on file   Number of children: Not on file   Years of education: Not on file   Highest education level: Not on file  Occupational History   Not on file  Tobacco Use   Smoking status: Never   Smokeless tobacco: Never  Substance and Sexual Activity   Alcohol use: Yes    Comment: occasionally   Drug use: Never   Sexual activity: Not on file  Other Topics Concern   Not on file  Social History Narrative   Not on file   Social Drivers of Health   Financial Resource Strain: Not on file  Food Insecurity: Not on file  Transportation Needs: Not on file  Physical Activity: Not on file  Stress: Not on file  Social Connections: Not on file  Intimate Partner Violence: Not  on file    Review of Systems: See HPI, otherwise negative ROS  Physical Exam: Vital signs in last 24 hours:     General:   Alert,  Well-developed, well-nourished, pleasant and cooperative in NAD Head:  Normocephalic and atraumatic. Eyes:  Sclera clear, no icterus.   Conjunctiva pink. Ears:  Normal auditory acuity. Nose:  No deformity, discharge,  or lesions. Msk:  Symmetrical without gross deformities. Normal posture. Extremities:  Without clubbing or edema. Neurologic:  Alert and  oriented x4;  grossly normal neurologically. Skin:  Intact without significant lesions or rashes. Psych:  Alert and cooperative. Normal mood and affect.  Impression/Plan: Jamie Russell is here for a colonoscopy to be performed for colon cancer screening purposes.  The risks of the procedure including infection, bleed, or perforation as well as benefits, limitations, alternatives and imponderables have been reviewed with the patient. Questions have been answered. All parties agreeable.

## 2023-03-05 ENCOUNTER — Encounter (HOSPITAL_COMMUNITY): Payer: Self-pay | Admitting: Gastroenterology

## 2023-08-31 ENCOUNTER — Other Ambulatory Visit: Payer: Self-pay | Admitting: Thoracic Surgery (Cardiothoracic Vascular Surgery)

## 2023-08-31 DIAGNOSIS — I712 Thoracic aortic aneurysm, without rupture, unspecified: Secondary | ICD-10-CM

## 2023-10-07 ENCOUNTER — Ambulatory Visit (HOSPITAL_COMMUNITY)
Admission: RE | Admit: 2023-10-07 | Discharge: 2023-10-07 | Disposition: A | Discharge status: Home / Self Care | Source: Ambulatory Visit | Attending: Internal Medicine | Admitting: Internal Medicine

## 2023-10-07 DIAGNOSIS — I712 Thoracic aortic aneurysm, without rupture, unspecified: Secondary | ICD-10-CM | POA: Diagnosis present

## 2023-10-07 DIAGNOSIS — I2510 Atherosclerotic heart disease of native coronary artery without angina pectoris: Secondary | ICD-10-CM | POA: Diagnosis not present

## 2023-10-07 MED ORDER — IOHEXOL 350 MG/ML SOLN
75.0000 mL | Freq: Once | INTRAVENOUS | Status: AC | PRN
Start: 2023-10-07 — End: 2023-10-07
  Administered 2023-10-07: 75 mL via INTRAVENOUS

## 2023-10-15 ENCOUNTER — Ambulatory Visit

## 2023-10-15 VITALS — BP 138/80 | HR 64 | Resp 18 | Ht 69.0 in | Wt 200.0 lb

## 2023-10-15 DIAGNOSIS — I712 Thoracic aortic aneurysm, without rupture, unspecified: Secondary | ICD-10-CM | POA: Diagnosis not present

## 2023-10-15 NOTE — Patient Instructions (Signed)

## 2023-10-15 NOTE — Progress Notes (Addendum)
 [address] [place] [address]             [phone]            Jamie Russell [id number] 04-Apr-1960   History of Present Illness:  Jamie Russell is a 63 year old man with medical history of asthma, hypertension, and kidney stones who presents for continued surveillance of ascending thoracic aortic aneurysm.  CTA of chest on 10/07/2023 measured aneurysm at 4.1 cm.  He presents today to the clinic with his wife and reports that he is doing well.  His blood pressure is controlled with out the use of medications.  Over the past year he has been doing the keto diet and has lost 50 pounds. He has been able to maintain the diet and weight loss.  He is active and denies heavy lifting.  He denies chest pain, shortness of breath and lower leg swelling.  He does report having a rash that appeared after having contrast for CTA.  He also changed his body wash the same day.  He is unclear if rash is due to contrast or soap.    Current Outpatient Medications on File Prior to Visit  Medication Sig Dispense Refill   albuterol (VENTOLIN HFA) 108 (90 Base) MCG/ACT inhaler INHALE 1 TO 2 PUFFS EVERY 4 HOURS AS NEEDED     montelukast (SINGULAIR) 10 MG tablet Take 10 mg by mouth daily.     No current facility-administered medications on file prior to visit.     ROS: Review of Systems  Constitutional: Negative.  Negative for fever and malaise/fatigue.  Respiratory: Negative.  Negative for cough, shortness of breath and wheezing.   Cardiovascular: Negative.  Negative for chest pain, palpitations and leg swelling.     BP 138/80   Pulse 64   Resp 18   Ht 5' 9 (1.753 m)   Wt 200 lb (90.7 kg)   SpO2 96%   BMI 29.53 kg/m   Physical Exam Constitutional:      Appearance: Normal appearance.  HENT:     Head: Normocephalic and atraumatic.  Cardiovascular:     Rate and Rhythm: Normal rate and regular rhythm.     Heart sounds: Normal heart sounds, S1 normal and S2  normal.  Pulmonary:     Effort: Pulmonary effort is normal.     Breath sounds: Normal breath sounds.  Skin:    General: Skin is warm and dry.  Neurological:     General: No focal deficit present.     Mental Status: He is alert and oriented to person, place, and time.      Imaging: CLINICAL DATA:  64 year old male with history of thoracic aortic aneurysm. Follow-up study.   EXAM: CT ANGIOGRAPHY CHEST WITH CONTRAST   TECHNIQUE: Multidetector CT imaging of the chest was performed using the standard protocol during bolus administration of intravenous contrast. Multiplanar CT image reconstructions and MIPs were obtained to evaluate the vascular anatomy.   RADIATION DOSE REDUCTION: This exam was performed according to the departmental dose-optimization program which includes automated exposure control, adjustment of the mA and/or kV according to patient size and/or use of iterative reconstruction technique.   CONTRAST:  Seventy-five mL Omnipaque  350, intravenous   COMPARISON:  10/06/2022   FINDINGS: Cardiovascular: Preferential opacification of the thoracic aorta. No evidence of thoracic aortic dissection. Normal heart size. No pericardial effusion.   Sinues of Valsalva: 30 mm 31 x 31  mm ,unchanged   Sinotubular Junction: 35 mm ,unchanged   Ascending Aorta: 41 mm ,unchanged   Aortic Arch: 35 mm ,unchanged   Descending aorta: 27 mm at the level of the carina ,unchanged   Branch vessels: Conventional branching pattern. No significant atherosclerotic changes.   Coronary arteries: Normal origins and courses. Three-vessel atherosclerotic calcifications.   Main pulmonary artery: 25 mm ,unchanged. No evidence of central pulmonary embolism.   Pulmonary veins: No anomalous pulmonary venous return. No evidence of left atrial appendage thrombus.   Upper abdominal vasculature: Within normal limits.   Mediastinum/Nodes: No enlarged mediastinal, hilar, or axillary  lymph nodes. Thyroid gland, trachea, and esophagus demonstrate no significant findings.   Lungs/Pleura: No focal consolidations. Similar cicatricial atelectasis in the peripheral lingula. Unchanged punctate calcified granuloma in the right upper lobe. No suspicious pulmonary nodules. No pleural effusion or pneumothorax.   Upper Abdomen: The visualized upper abdomen is within normal limits.   Musculoskeletal: No chest wall abnormality. No acute or significant osseous findings.   Review of the MIP images confirms the above findings.   IMPRESSION: Vascular:   1. Stable fusiform ascending thoracic aortic aneurysm measuring up to 4.1 cm. Recommend annual imaging followup by CTA or MRA. This recommendation follows 2010 ACCF/AHA/AATS/ACR/ASA/SCA/SCAI/SIR/STS/SVM Guidelines for the Diagnosis and Management of Patients with Thoracic Aortic Disease. Circulation. 2010; 121: Z733-z630. Aortic aneurysm NOS (ICD10-I71.9) 2. Three vessel coronary atherosclerotic calcifications.   Non-Vascular:   No acute intrathoracic abnormality.   Ester Sides, MD   [facility]   [facility]     Electronically Signed   By: Ester Sides M.D.   On: 10/07/2023 09:52       A/P: Thoracic aortic aneurysm without rupture, unspecified part  -4.1 cm ascending thoracic aortic aneurysm on CTA of chest. We discussed the natural history and and risk factors for growth of ascending aortic aneurysms. Discussed recommendations to minimize the risk of further expansion or dissection including careful blood pressure control, avoidance of contact sports and heavy lifting, attention to lipid management.  We covered the importance of staying never user of tobacco.  The patient does not yet meet surgical criteria of >5.5cm. The patient is aware of signs and symptoms of aortic dissection and when to present to the emergency department  -Rash has improved at this time.  If  patient develops rash next year after CT with contrast then may have allergy.   -Follow up in one year with CTA of chest   Risk Modification:  Statin:  not currently prescribed   Smoking cessation instruction/counseling given:  never user  Patient was counseled on importance of Blood Pressure Control  They are instructed to contact their Primary Care Physician if they start to have blood pressure readings over 130s/90s. Do not ever stop blood pressure medications on your own, unless instructed by healthcare professional.  Please avoid use of Fluoroquinolones as this can potentially increase your risk of Aortic Rupture and/or Dissection  Patient educated on signs and symptoms of Aortic Dissection, handout also provided in AVS  Manuelita CHRISTELLA Rough, PA-C 10/15/23

## 2023-10-28 ENCOUNTER — Encounter (HOSPITAL_COMMUNITY): Payer: Self-pay | Admitting: *Deleted

## 2023-10-28 ENCOUNTER — Emergency Department (HOSPITAL_COMMUNITY)
Admission: EM | Admit: 2023-10-28 | Discharge: 2023-10-28 | Disposition: A | Discharge status: Home / Self Care | Attending: Emergency Medicine | Admitting: Emergency Medicine

## 2023-10-28 ENCOUNTER — Other Ambulatory Visit: Payer: Self-pay

## 2023-10-28 ENCOUNTER — Emergency Department (HOSPITAL_COMMUNITY)

## 2023-10-28 DIAGNOSIS — J45909 Unspecified asthma, uncomplicated: Secondary | ICD-10-CM | POA: Insufficient documentation

## 2023-10-28 DIAGNOSIS — Z23 Encounter for immunization: Secondary | ICD-10-CM | POA: Insufficient documentation

## 2023-10-28 DIAGNOSIS — T148XXA Other injury of unspecified body region, initial encounter: Secondary | ICD-10-CM

## 2023-10-28 DIAGNOSIS — S6991XA Unspecified injury of right wrist, hand and finger(s), initial encounter: Secondary | ICD-10-CM

## 2023-10-28 DIAGNOSIS — S61411A Laceration without foreign body of right hand, initial encounter: Secondary | ICD-10-CM | POA: Diagnosis present

## 2023-10-28 DIAGNOSIS — W268XXA Contact with other sharp object(s), not elsewhere classified, initial encounter: Secondary | ICD-10-CM | POA: Insufficient documentation

## 2023-10-28 DIAGNOSIS — I10 Essential (primary) hypertension: Secondary | ICD-10-CM | POA: Insufficient documentation

## 2023-10-28 MED ORDER — HYDROCODONE-ACETAMINOPHEN 5-325 MG PO TABS: ORAL_TABLET | ORAL | 0 refills | Status: AC

## 2023-10-28 MED ORDER — CEPHALEXIN 500 MG PO CAPS: 500.0000 mg | ORAL_CAPSULE | Freq: Four times a day (QID) | ORAL | 0 refills | Status: AC

## 2023-10-28 MED ORDER — TETANUS-DIPHTH-ACELL PERTUSSIS 5-2-15.5 LF-MCG/0.5 IM SUSP
0.5000 mL | Freq: Once | INTRAMUSCULAR | Status: AC
  Administered 2023-10-28: 0.5 mL via INTRAMUSCULAR
  Filled 2023-10-28: qty 0.5

## 2023-10-28 MED ORDER — CEFAZOLIN SODIUM-DEXTROSE 2-4 GM/100ML-% IV SOLN
2.0000 g | INTRAVENOUS | Status: AC
  Administered 2023-10-28: 2 g via INTRAVENOUS
  Filled 2023-10-28: qty 100

## 2023-10-28 NOTE — ED Provider Notes (Signed)
 Jamie Russell EMERGENCY DEPARTMENT AT [facility] Provider Note   CSN: [id number] Arrival date & time: 10/28/23  1455     Patient presents with: Hand Injury   Jamie Russell is a 63 y.o. male.    Hand Injury Associated symptoms: no fever        Jamie Russell is a 63 y.o. male past medical history of hypertension, kidney stones and asthma who presents to the Emergency Department complaining of laceration of the dorsal right hand.  States he was using a crossbow when the string caused laceration of the skin and underlying tissue between the thumb and index finger.  Incident occurred approximately 1 hour prior to arrival.  Bleeding controlled using direct pressure.  Minimal tenderness with movement of his thumb and index finger.  He denies any numbness of his hand or fingers.  Does not take blood thinners.  Right-hand-dominant.  Last Td unknown.  Prior to Admission medications   Medication Sig Start Date End Date Taking? Authorizing Provider  albuterol (VENTOLIN HFA) 108 (90 Base) MCG/ACT inhaler INHALE 1 TO 2 PUFFS EVERY 4 HOURS AS NEEDED 09/20/20   [provider]  montelukast (SINGULAIR) 10 MG tablet Take 10 mg by mouth daily. 07/09/21   [provider]    Allergies: Quinolones    Review of Systems  Constitutional:  Negative for appetite change, chills and fever.  Respiratory:  Negative for shortness of breath.   Cardiovascular:  Negative for chest pain.  Gastrointestinal:  Negative for abdominal pain, diarrhea, nausea and vomiting.  Skin:  Positive for wound.  Neurological:  Negative for weakness, numbness and headaches.    Updated Vital Signs BP 114/71   Pulse 60   Temp 97.8 F (36.6 C) (Temporal)   Resp 19   Ht 5' 9 (1.753 m)   Wt 90.7 kg   SpO2 92%   BMI 29.53 kg/m   Physical Exam Vitals and nursing note reviewed.  Constitutional:      General: He is not in acute distress.    Appearance: Normal appearance. He is not ill-appearing or  toxic-appearing.  Cardiovascular:     Rate and Rhythm: Normal rate and regular rhythm.     Pulses: Normal pulses.     Heart sounds: No murmur heard. Pulmonary:     Effort: Pulmonary effort is normal.     Breath sounds: Normal breath sounds.  Musculoskeletal:        General: Tenderness and signs of injury present. No swelling or deformity.     Right hand: Laceration present. No swelling, deformity or bony tenderness. Normal range of motion. Normal strength. Normal sensation. There is no disruption of two-point discrimination. Normal capillary refill. Normal pulse.     Comments: Flap type laceration with partial avulsion of the skin and underlying tissue.  No active bleeding.  No obvious foreign bodies, or obvious bony deformity.  Patient has full range of motion of the thumb index and middle fingers of the right hand.  Normal finger thumb opposition.  See attached photo  Neurological:     Mental Status: He is alert.      (all labs ordered are listed, but only abnormal results are displayed) Labs Reviewed - No data to display  EKG: None  Radiology: DG Hand Complete Right Result Date: 10/28/2023 CLINICAL DATA:  Right hand injury.  Limited range of motion. EXAM: RIGHT HAND - COMPLETE 3+ VIEW COMPARISON:  No prior comparison studies are available. FINDINGS: Degenerative changes in the interphalangeal joints. There  is suggestion of a nondisplaced volar plate injury to the middle phalanx of probably the fourth finger although due to overlap it is difficult to determine which fingers involved. Correlate with physical examination. Otherwise, no acute fracture or dislocation is seen. Old ununited ossicle over the ulnar styloid process. Soft tissues are unremarkable. IMPRESSION: Nondisplaced volar plate injury probably to the middle phalanx of the fourth finger. Degenerative changes. Electronically Signed   By: Elsie Gravely M.D.   On: 10/28/2023 16:56     Procedures   Medications Ordered  in the ED  Tdap (ADACEL) injection 0.5 mL (has no administration in time range)  ceFAZolin (ANCEF) IVPB 2g/100 mL premix (has no administration in time range)                                    Medical Decision Making Patient here for evaluation of right hand injury while using a crossbow.  He has avulsion of the skin to the webspace between the thumb and index finger.  Bleeding controlled prior to arrival.  He has full range of motion of the fingers of the right hand.  Do not appreciate any foreign bodies or obvious bony injuries on exam.  Last Td unknown.  Skin avulsed and missing.  Unable to suture.  There is a small skin flap, will approximate is much as possible but likely superficial and nonviable.  Will allow to heal with secondary intent.  Will update tetanus, clean wound and obtain x-ray  Suspect this is laceration with avulsion of the skin.  Foreign body, fracture, dislocation alcohol also considered but felt less likely.  Doubt neurovascular injury    Amount and/or Complexity of Data Reviewed Radiology: ordered.    Details: X-ray shows nondisplaced volar plate fracture middle phalanx of the fourth finger. Discussion of management or test interpretation with external provider(s): Patient has skin avulsion to the first webspace of the right hand.  No active bleeding.  Neurovascularly intact.  He has normal range of motion of the fingers of the right hand.  X-ray shows possible volar plate fracture of the middle phalanx of the fourth finger, there is no injury to this finger and he is able to perform range of motion of the finger without tenderness.  I feel this is incidental finding and not related to his current injury.  He was given IV cefazolin, wound was irrigated with saline by me.  Small remaining flap of skin was approximated the patient understands that this is likely nonviable.  His tetanus was updated here and nonadherent dressing applied.  I have recommended close outpatient  follow-up with orthopedics, he request to follow-up with [facility] as he has seen them in the past.  Also discussed that he may need follow-up with wound care clinic at orthopedic discretion  Risk Prescription drug management.        Final diagnoses:  Injury of right hand, initial encounter  Deep wound of skin due to avulsion    ED Discharge Orders     None          Herlinda Madelin RIGGERS 10/28/23 2343    Cleotilde Rogue, MD 10/31/23 1816

## 2023-10-28 NOTE — Discharge Instructions (Signed)
 You have been given IV antibiotics and your x-ray did not show evidence of an open fracture.  Your wound has been cleaned and bandaged and your tetanus has been updated.  I recommend that you elevate your hand when possible.  Clean the wound gently with antibiotic soap and water.  Keep the area bandaged.  Please call your orthopedic provider to arrange follow-up appointment.  Your x-ray today also showed a possible injury to the middle phalanx of the fourth finger, but you do not have tenderness to this area so this may be related to an old injury and it is doubtful this is related to your injury today.  Return to the emergency department for any new or worsening symptoms.

## 2023-10-28 NOTE — ED Triage Notes (Signed)
 Pt with injury to right hand while using a crossbow.  Pt not paying attention and had his right hand above the string.
